# Patient Record
Sex: Female | Born: 1990 | Race: White | Hispanic: No | Marital: Single | Smoking: Current every day smoker
Health system: Southern US, Community
[De-identification: ages and names within clinical notes are randomized; demographics above are authoritative.]

## PROBLEM LIST (undated history)

## (undated) DIAGNOSIS — F1990 Other psychoactive substance use, unspecified, uncomplicated: Secondary | ICD-10-CM

---
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
## 2021-01-19 ENCOUNTER — Encounter: Payer: Self-pay | Admitting: Emergency Medicine

## 2021-01-19 ENCOUNTER — Emergency Department: Payer: Self-pay

## 2021-01-19 ENCOUNTER — Inpatient Hospital Stay: Payer: Self-pay | Admitting: Anesthesiology

## 2021-01-19 ENCOUNTER — Other Ambulatory Visit: Payer: Self-pay

## 2021-01-19 ENCOUNTER — Encounter
Admission: EM | Discharge status: Against Medical Advice | Payer: Self-pay | Source: Home / Self Care | Attending: Internal Medicine

## 2021-01-19 ENCOUNTER — Inpatient Hospital Stay
Admission: EM | Admit: 2021-01-19 | Discharge: 2021-01-23 | DRG: 872 | Discharge status: Against Medical Advice | Payer: Self-pay | Attending: Student | Admitting: Student

## 2021-01-19 DIAGNOSIS — S41102A Unspecified open wound of left upper arm, initial encounter: Secondary | ICD-10-CM

## 2021-01-19 DIAGNOSIS — W57XXXA Bitten or stung by nonvenomous insect and other nonvenomous arthropods, initial encounter: Secondary | ICD-10-CM | POA: Diagnosis present

## 2021-01-19 DIAGNOSIS — N390 Urinary tract infection, site not specified: Secondary | ICD-10-CM | POA: Diagnosis present

## 2021-01-19 DIAGNOSIS — F1990 Other psychoactive substance use, unspecified, uncomplicated: Secondary | ICD-10-CM

## 2021-01-19 DIAGNOSIS — Z20822 Contact with and (suspected) exposure to covid-19: Secondary | ICD-10-CM | POA: Diagnosis present

## 2021-01-19 DIAGNOSIS — A4101 Sepsis due to Methicillin susceptible Staphylococcus aureus: Principal | ICD-10-CM | POA: Diagnosis present

## 2021-01-19 DIAGNOSIS — F1390 Sedative, hypnotic, or anxiolytic use, unspecified, uncomplicated: Secondary | ICD-10-CM

## 2021-01-19 DIAGNOSIS — A419 Sepsis, unspecified organism: Secondary | ICD-10-CM

## 2021-01-19 DIAGNOSIS — I96 Gangrene, not elsewhere classified: Secondary | ICD-10-CM | POA: Diagnosis present

## 2021-01-19 DIAGNOSIS — F1110 Opioid abuse, uncomplicated: Secondary | ICD-10-CM | POA: Diagnosis present

## 2021-01-19 DIAGNOSIS — F17210 Nicotine dependence, cigarettes, uncomplicated: Secondary | ICD-10-CM | POA: Diagnosis present

## 2021-01-19 DIAGNOSIS — L03116 Cellulitis of left lower limb: Secondary | ICD-10-CM | POA: Diagnosis present

## 2021-01-19 DIAGNOSIS — Z5329 Procedure and treatment not carried out because of patient's decision for other reasons: Secondary | ICD-10-CM | POA: Diagnosis present

## 2021-01-19 DIAGNOSIS — R7881 Bacteremia: Secondary | ICD-10-CM

## 2021-01-19 DIAGNOSIS — B9620 Unspecified Escherichia coli [E. coli] as the cause of diseases classified elsewhere: Secondary | ICD-10-CM | POA: Diagnosis present

## 2021-01-19 DIAGNOSIS — F1490 Cocaine use, unspecified, uncomplicated: Secondary | ICD-10-CM

## 2021-01-19 DIAGNOSIS — S41101A Unspecified open wound of right upper arm, initial encounter: Secondary | ICD-10-CM

## 2021-01-19 DIAGNOSIS — L02416 Cutaneous abscess of left lower limb: Secondary | ICD-10-CM | POA: Diagnosis present

## 2021-01-19 DIAGNOSIS — S41109A Unspecified open wound of unspecified upper arm, initial encounter: Secondary | ICD-10-CM

## 2021-01-19 DIAGNOSIS — F1190 Opioid use, unspecified, uncomplicated: Secondary | ICD-10-CM

## 2021-01-19 DIAGNOSIS — S80869A Insect bite (nonvenomous), unspecified lower leg, initial encounter: Secondary | ICD-10-CM | POA: Diagnosis present

## 2021-01-19 HISTORY — PX: I & D EXTREMITY: SHX5045

## 2021-01-19 HISTORY — DX: Other psychoactive substance use, unspecified, uncomplicated: F19.90

## 2021-01-19 HISTORY — PX: INCISION AND DRAINAGE OF WOUND: SHX1803

## 2021-01-19 LAB — CBC WITH DIFFERENTIAL/PLATELET
Abs Immature Granulocytes: 0.83 10*3/uL — ABNORMAL HIGH (ref 0.00–0.07)
Basophils Absolute: 0.1 10*3/uL (ref 0.0–0.1)
Basophils Relative: 0 %
Eosinophils Absolute: 0.6 10*3/uL — ABNORMAL HIGH (ref 0.0–0.5)
Eosinophils Relative: 2 %
HCT: 26.5 % — ABNORMAL LOW (ref 36.0–46.0)
Hemoglobin: 8.5 g/dL — ABNORMAL LOW (ref 12.0–15.0)
Immature Granulocytes: 3 %
Lymphocytes Relative: 7 %
Lymphs Abs: 1.7 10*3/uL (ref 0.7–4.0)
MCH: 24.6 pg — ABNORMAL LOW (ref 26.0–34.0)
MCHC: 32.1 g/dL (ref 30.0–36.0)
MCV: 76.6 fL — ABNORMAL LOW (ref 80.0–100.0)
Monocytes Absolute: 1.2 10*3/uL — ABNORMAL HIGH (ref 0.1–1.0)
Monocytes Relative: 5 %
Neutro Abs: 20.5 10*3/uL — ABNORMAL HIGH (ref 1.7–7.7)
Neutrophils Relative %: 83 %
Platelets: 443 10*3/uL — ABNORMAL HIGH (ref 150–400)
RBC: 3.46 MIL/uL — ABNORMAL LOW (ref 3.87–5.11)
RDW: 16.9 % — ABNORMAL HIGH (ref 11.5–15.5)
WBC: 24.8 10*3/uL — ABNORMAL HIGH (ref 4.0–10.5)
nRBC: 0 % (ref 0.0–0.2)

## 2021-01-19 LAB — URINALYSIS, COMPLETE (UACMP) WITH MICROSCOPIC
Bilirubin Urine: NEGATIVE
Glucose, UA: NEGATIVE mg/dL
Hgb urine dipstick: NEGATIVE
Ketones, ur: 5 mg/dL — AB
Leukocytes,Ua: NEGATIVE
Nitrite: POSITIVE — AB
Protein, ur: NEGATIVE mg/dL
Specific Gravity, Urine: 1.03 (ref 1.005–1.030)
pH: 5 (ref 5.0–8.0)

## 2021-01-19 LAB — COMPREHENSIVE METABOLIC PANEL
ALT: 13 U/L (ref 0–44)
AST: 15 U/L (ref 15–41)
Albumin: 2.4 g/dL — ABNORMAL LOW (ref 3.5–5.0)
Alkaline Phosphatase: 110 U/L (ref 38–126)
Anion gap: 9 (ref 5–15)
BUN: 18 mg/dL (ref 6–20)
CO2: 24 mmol/L (ref 22–32)
Calcium: 8.4 mg/dL — ABNORMAL LOW (ref 8.9–10.3)
Chloride: 107 mmol/L (ref 98–111)
Creatinine, Ser: 0.79 mg/dL (ref 0.44–1.00)
GFR, Estimated: >60 mL/min (ref >60)
Glucose, Bld: 156 mg/dL — ABNORMAL HIGH (ref 70–99)
Potassium: 4.8 mmol/L (ref 3.5–5.1)
Sodium: 140 mmol/L (ref 135–145)
Total Bilirubin: 0.2 mg/dL — ABNORMAL LOW (ref 0.3–1.2)
Total Protein: 6.3 g/dL — ABNORMAL LOW (ref 6.5–8.1)

## 2021-01-19 LAB — RESP PANEL BY RT-PCR (FLU A&B, COVID) ARPGX2
Influenza A by PCR: NEGATIVE
Influenza B by PCR: NEGATIVE
SARS Coronavirus 2 by RT PCR: NEGATIVE

## 2021-01-19 LAB — POC URINE PREG, ED: Preg Test, Ur: NEGATIVE

## 2021-01-19 LAB — LACTIC ACID, PLASMA
Lactic Acid, Venous: 2.1 mmol/L (ref 0.5–1.9)
Lactic Acid, Venous: 1.9 mmol/L (ref 0.5–1.9)

## 2021-01-19 LAB — CK: Total CK: 95 U/L (ref 38–234)

## 2021-01-19 LAB — APTT: aPTT: 36 seconds (ref 24–36)

## 2021-01-19 LAB — PROTIME-INR
INR: 1.1 (ref 0.8–1.2)
Prothrombin Time: 14.5 seconds (ref 11.4–15.2)

## 2021-01-19 LAB — SEDIMENTATION RATE: Sed Rate: 49 mm/hr — ABNORMAL HIGH (ref 0–20)

## 2021-01-19 SURGERY — IRRIGATION AND DEBRIDEMENT WOUND
Anesthesia: General | Site: Leg Lower | Laterality: Left

## 2021-01-19 MED ORDER — LIDOCAINE HCL (CARDIAC) PF 100 MG/5ML IV SOSY
PREFILLED_SYRINGE | INTRAVENOUS | Status: DC | PRN
Start: 2021-01-19 — End: 2021-01-19
  Administered 2021-01-19: 60 mg via INTRAVENOUS

## 2021-01-19 MED ORDER — PHENYLEPHRINE HCL (PRESSORS) 10 MG/ML IV SOLN
INTRAVENOUS | Status: DC | PRN
  Administered 2021-01-19 (×2): 100 ug via INTRAVENOUS

## 2021-01-19 MED ORDER — ACETAMINOPHEN 10 MG/ML IV SOLN
INTRAVENOUS | Status: AC
  Administered 2021-01-19: 1000 mg via INTRAVENOUS
  Filled 2021-01-19: qty 100

## 2021-01-19 MED ORDER — ONDANSETRON HCL 4 MG/2ML IJ SOLN
INTRAMUSCULAR | Status: DC | PRN
  Administered 2021-01-19: 4 mg via INTRAVENOUS

## 2021-01-19 MED ORDER — KETAMINE HCL 10 MG/ML IJ SOLN
INTRAMUSCULAR | Status: AC
  Administered 2021-01-19: 18 mg
  Filled 2021-01-19: qty 1

## 2021-01-19 MED ORDER — HYDROMORPHONE HCL 1 MG/ML IJ SOLN
0.5000 mg | INTRAMUSCULAR | Status: DC | PRN
  Administered 2021-01-19 – 2021-01-23 (×9): 0.5 mg via INTRAVENOUS
  Filled 2021-01-19 (×9): qty 0.5

## 2021-01-19 MED ORDER — MIDAZOLAM HCL 2 MG/2ML IJ SOLN
INTRAMUSCULAR | Status: DC | PRN
  Administered 2021-01-19: 2 mg via INTRAVENOUS

## 2021-01-19 MED ORDER — LACTATED RINGERS IV BOLUS (SEPSIS)
1000.0000 mL | Freq: Once | INTRAVENOUS | Status: AC
  Administered 2021-01-19: 1000 mL via INTRAVENOUS

## 2021-01-19 MED ORDER — DEXAMETHASONE SODIUM PHOSPHATE 10 MG/ML IJ SOLN
INTRAMUSCULAR | Status: DC | PRN
  Administered 2021-01-19: 5 mg via INTRAVENOUS

## 2021-01-19 MED ORDER — LACTATED RINGERS IV SOLN: INTRAVENOUS | Status: DC

## 2021-01-19 MED ORDER — 0.9 % SODIUM CHLORIDE (POUR BTL) OPTIME
TOPICAL | Status: DC | PRN
  Administered 2021-01-19: 450 mL

## 2021-01-19 MED ORDER — KETOROLAC TROMETHAMINE 30 MG/ML IJ SOLN
30.0000 mg | Freq: Four times a day (QID) | INTRAMUSCULAR | Status: DC
  Administered 2021-01-20 – 2021-01-23 (×13): 30 mg via INTRAVENOUS
  Filled 2021-01-19 (×13): qty 1

## 2021-01-19 MED ORDER — FENTANYL CITRATE (PF) 100 MCG/2ML IJ SOLN
INTRAMUSCULAR | Status: DC | PRN
  Administered 2021-01-19 (×2): 50 ug via INTRAVENOUS
  Administered 2021-01-19: 100 ug via INTRAVENOUS

## 2021-01-19 MED ORDER — OXYCODONE HCL 5 MG PO TABS
ORAL_TABLET | ORAL | Status: AC
  Filled 2021-01-19: qty 1

## 2021-01-19 MED ORDER — VANCOMYCIN HCL 500 MG/100ML IV SOLN
500.0000 mg | Freq: Once | INTRAVENOUS | Status: AC
  Administered 2021-01-19: 500 mg via INTRAVENOUS
  Filled 2021-01-19 (×2): qty 100

## 2021-01-19 MED ORDER — SODIUM CHLORIDE 0.9 % IV SOLN
2.0000 g | Freq: Once | INTRAVENOUS | Status: AC
  Administered 2021-01-19: 2 g via INTRAVENOUS
  Filled 2021-01-19: qty 2

## 2021-01-19 MED ORDER — ONDANSETRON HCL 4 MG/2ML IJ SOLN: 4.0000 mg | Freq: Once | INTRAMUSCULAR | Status: DC | PRN

## 2021-01-19 MED ORDER — KETAMINE HCL 10 MG/ML IJ SOLN: 0.3000 mg/kg | Freq: Once | INTRAMUSCULAR | Status: AC

## 2021-01-19 MED ORDER — VANCOMYCIN HCL 1250 MG/250ML IV SOLN
1250.0000 mg | INTRAVENOUS | Status: DC
  Filled 2021-01-19: qty 250

## 2021-01-19 MED ORDER — ONDANSETRON HCL 4 MG PO TABS: 4.0000 mg | ORAL_TABLET | Freq: Four times a day (QID) | ORAL | Status: DC | PRN

## 2021-01-19 MED ORDER — SODIUM CHLORIDE 0.9 % IV SOLN
2.0000 g | Freq: Three times a day (TID) | INTRAVENOUS | Status: DC
  Administered 2021-01-19 – 2021-01-20 (×2): 2 g via INTRAVENOUS
  Filled 2021-01-19 (×5): qty 2

## 2021-01-19 MED ORDER — SODIUM CHLORIDE 0.9 % IV SOLN: Freq: Once | INTRAVENOUS | Status: AC

## 2021-01-19 MED ORDER — FENTANYL CITRATE (PF) 100 MCG/2ML IJ SOLN
INTRAMUSCULAR | Status: AC
  Filled 2021-01-19: qty 2

## 2021-01-19 MED ORDER — PROPOFOL 10 MG/ML IV BOLUS
INTRAVENOUS | Status: AC
  Filled 2021-01-19: qty 20

## 2021-01-19 MED ORDER — OXYCODONE HCL 5 MG PO TABS
5.0000 mg | ORAL_TABLET | Freq: Once | ORAL | Status: AC | PRN
  Administered 2021-01-19: 5 mg via ORAL

## 2021-01-19 MED ORDER — ACETAMINOPHEN 650 MG RE SUPP: 650.0000 mg | Freq: Four times a day (QID) | RECTAL | Status: DC | PRN

## 2021-01-19 MED ORDER — OXYCODONE HCL 5 MG/5ML PO SOLN
5.0000 mg | Freq: Once | ORAL | Status: AC | PRN
Start: 2021-01-19 — End: 2021-01-19

## 2021-01-19 MED ORDER — ACETAMINOPHEN 325 MG PO TABS: 650.0000 mg | ORAL_TABLET | Freq: Four times a day (QID) | ORAL | Status: DC | PRN

## 2021-01-19 MED ORDER — HYDROMORPHONE HCL 1 MG/ML IJ SOLN: 1.0000 mg | INTRAMUSCULAR | Status: DC | PRN

## 2021-01-19 MED ORDER — SUCCINYLCHOLINE CHLORIDE 200 MG/10ML IV SOSY
PREFILLED_SYRINGE | INTRAVENOUS | Status: DC | PRN
Start: 2021-01-19 — End: 2021-01-19
  Administered 2021-01-19: 80 mg via INTRAVENOUS

## 2021-01-19 MED ORDER — PROPOFOL 10 MG/ML IV BOLUS
INTRAVENOUS | Status: DC | PRN
  Administered 2021-01-19: 150 mg via INTRAVENOUS

## 2021-01-19 MED ORDER — OXYCODONE HCL 5 MG PO TABS
5.0000 mg | ORAL_TABLET | ORAL | Status: DC | PRN
  Administered 2021-01-20: 5 mg via ORAL
  Administered 2021-01-20: 10 mg via ORAL
  Administered 2021-01-20: 5 mg via ORAL
  Administered 2021-01-20 – 2021-01-23 (×9): 10 mg via ORAL
  Filled 2021-01-19 (×12): qty 2

## 2021-01-19 MED ORDER — BUPIVACAINE LIPOSOME 1.3 % IJ SUSP
INTRAMUSCULAR | Status: AC
  Filled 2021-01-19: qty 20

## 2021-01-19 MED ORDER — ACETAMINOPHEN 500 MG PO TABS: 1000.0000 mg | ORAL_TABLET | Freq: Four times a day (QID) | ORAL | Status: DC | PRN

## 2021-01-19 MED ORDER — LORAZEPAM 2 MG/ML IJ SOLN
1.0000 mg | INTRAMUSCULAR | Status: DC | PRN
  Administered 2021-01-21 – 2021-01-23 (×8): 1 mg via INTRAVENOUS
  Filled 2021-01-19 (×8): qty 1

## 2021-01-19 MED ORDER — VANCOMYCIN HCL IN DEXTROSE 1-5 GM/200ML-% IV SOLN
1000.0000 mg | Freq: Once | INTRAVENOUS | Status: AC
  Administered 2021-01-19: 1000 mg via INTRAVENOUS
  Filled 2021-01-19: qty 200

## 2021-01-19 MED ORDER — MIDAZOLAM HCL 2 MG/2ML IJ SOLN
INTRAMUSCULAR | Status: AC
  Filled 2021-01-19: qty 2

## 2021-01-19 MED ORDER — FENTANYL CITRATE (PF) 100 MCG/2ML IJ SOLN
INTRAMUSCULAR | Status: AC
  Administered 2021-01-19: 25 ug via INTRAVENOUS
  Filled 2021-01-19: qty 2

## 2021-01-19 MED ORDER — ONDANSETRON HCL 4 MG/2ML IJ SOLN: 4.0000 mg | Freq: Four times a day (QID) | INTRAMUSCULAR | Status: DC | PRN

## 2021-01-19 MED ORDER — IOHEXOL 350 MG/ML SOLN
80.0000 mL | Freq: Once | INTRAVENOUS | Status: AC | PRN
  Administered 2021-01-19: 80 mL via INTRAVENOUS

## 2021-01-19 MED ORDER — FENTANYL CITRATE (PF) 100 MCG/2ML IJ SOLN
25.0000 ug | INTRAMUSCULAR | Status: DC | PRN
  Administered 2021-01-19: 25 ug via INTRAVENOUS

## 2021-01-19 MED ORDER — KETAMINE HCL 50 MG/5ML IJ SOSY: 0.3000 mg/kg | PREFILLED_SYRINGE | Freq: Once | INTRAMUSCULAR | Status: DC

## 2021-01-19 MED ORDER — METRONIDAZOLE 500 MG/100ML IV SOLN
500.0000 mg | Freq: Once | INTRAVENOUS | Status: AC
  Administered 2021-01-19: 500 mg via INTRAVENOUS
  Filled 2021-01-19: qty 100

## 2021-01-19 MED ORDER — DEXMEDETOMIDINE (PRECEDEX) IN NS 20 MCG/5ML (4 MCG/ML) IV SYRINGE
PREFILLED_SYRINGE | INTRAVENOUS | Status: DC | PRN
  Administered 2021-01-19 (×2): 8 ug via INTRAVENOUS

## 2021-01-19 MED ORDER — LACTATED RINGERS IV SOLN: INTRAVENOUS | Status: AC

## 2021-01-19 MED ORDER — ACETAMINOPHEN 10 MG/ML IV SOLN: 1000.0000 mg | Freq: Once | INTRAVENOUS | Status: DC | PRN

## 2021-01-19 MED ORDER — ROCURONIUM BROMIDE 100 MG/10ML IV SOLN
INTRAVENOUS | Status: DC | PRN
  Administered 2021-01-19: 20 mg via INTRAVENOUS
  Administered 2021-01-19: 30 mg via INTRAVENOUS

## 2021-01-19 MED ORDER — SUGAMMADEX SODIUM 200 MG/2ML IV SOLN
INTRAVENOUS | Status: DC | PRN
  Administered 2021-01-19: 150 mg via INTRAVENOUS

## 2021-01-19 MED ORDER — BUPIVACAINE-EPINEPHRINE 0.5% -1:200000 IJ SOLN
INTRAMUSCULAR | Status: DC | PRN
  Administered 2021-01-19: 48 mL via SURGICAL_CAVITY

## 2021-01-19 MED ORDER — BUPIVACAINE-EPINEPHRINE (PF) 0.5% -1:200000 IJ SOLN
INTRAMUSCULAR | Status: AC
  Filled 2021-01-19: qty 30

## 2021-01-19 SURGICAL SUPPLY — 41 items
APPLIER CLIP 9.375 SM OPEN (CLIP) ×3
BNDG GAUZE ELAST 4 BULKY (GAUZE/BANDAGES/DRESSINGS) ×9 IMPLANT
CANISTER SUCT 1200ML W/VALVE (MISCELLANEOUS) ×3 IMPLANT
CHLORAPREP W/TINT 26 (MISCELLANEOUS) IMPLANT
CLIP APPLIE 9.375 SM OPEN (CLIP) ×2 IMPLANT
CNTNR SPEC 2.5X3XGRAD LEK (MISCELLANEOUS)
CONT SPEC 4OZ STER OR WHT (MISCELLANEOUS)
CONTAINER SPEC 2.5X3XGRAD LEK (MISCELLANEOUS) IMPLANT
DRAIN PENROSE 1/4X12 LTX STRL (WOUND CARE) IMPLANT
DRAIN PENROSE 12X.25 LTX STRL (MISCELLANEOUS) ×6 IMPLANT
DRAPE LAPAROTOMY 77X122 PED (DRAPES) ×3 IMPLANT
DRSG GAUZE FLUFF 36X18 (GAUZE/BANDAGES/DRESSINGS) IMPLANT
ELECT CAUTERY BLADE TIP 2.5 (TIP) ×3
ELECT REM PT RETURN 9FT ADLT (ELECTROSURGICAL) ×3
ELECTRODE CAUTERY BLDE TIP 2.5 (TIP) ×2 IMPLANT
ELECTRODE REM PT RTRN 9FT ADLT (ELECTROSURGICAL) ×2 IMPLANT
GAUZE 4X4 16PLY ~~LOC~~+RFID DBL (SPONGE) ×3 IMPLANT
GAUZE SPONGE 4X4 12PLY STRL (GAUZE/BANDAGES/DRESSINGS) ×9 IMPLANT
GLOVE SURG SYN 7.0 (GLOVE) ×6 IMPLANT
GLOVE SURG SYN 7.5  E (GLOVE) ×2
GLOVE SURG SYN 7.5 E (GLOVE) ×4 IMPLANT
GOWN STRL REUS W/ TWL LRG LVL3 (GOWN DISPOSABLE) ×4 IMPLANT
GOWN STRL REUS W/TWL LRG LVL3 (GOWN DISPOSABLE) ×2
HANDLE YANKAUER SUCT BULB TIP (MISCELLANEOUS) ×6 IMPLANT
KIT TURNOVER KIT A (KITS) ×3 IMPLANT
LABEL OR SOLS (LABEL) IMPLANT
MANIFOLD NEPTUNE II (INSTRUMENTS) ×3 IMPLANT
NEEDLE HYPO 22GX1.5 SAFETY (NEEDLE) ×3 IMPLANT
NS IRRIG 500ML POUR BTL (IV SOLUTION) ×6 IMPLANT
PACK BASIN MINOR ARMC (MISCELLANEOUS) ×3 IMPLANT
PAD ABD DERMACEA PRESS 5X9 (GAUZE/BANDAGES/DRESSINGS) IMPLANT
SOL PREP PVP 2OZ (MISCELLANEOUS)
SOLUTION PREP PVP 2OZ (MISCELLANEOUS) IMPLANT
SPONGE T-LAP 18X18 ~~LOC~~+RFID (SPONGE) ×6 IMPLANT
SUT SILK 2 0 (SUTURE) ×1
SUT SILK 2-0 18XBRD TIE 12 (SUTURE) ×2 IMPLANT
SWAB CULTURE AMIES ANAERIB BLU (MISCELLANEOUS) ×9 IMPLANT
SYR 20ML LL LF (SYRINGE) ×3 IMPLANT
SYR BULB IRRIG 60ML STRL (SYRINGE) ×3 IMPLANT
TUBING CONNECTING 10 (TUBING) ×3 IMPLANT
WATER STERILE IRR 500ML POUR (IV SOLUTION) ×3 IMPLANT

## 2021-01-19 NOTE — ED Notes (Signed)
Ultrasound at bedside

## 2021-01-19 NOTE — ED Notes (Signed)
1 set of blood cultures to sent to lab at this time.

## 2021-01-19 NOTE — Transfer of Care (Signed)
Immediate Anesthesia Transfer of Care Note  Patient: Cindy Cohen  Procedure(s) Performed: IRRIGATION AND DEBRIDEMENT WOUND LEFT LOWER LEG (Left: Leg Lower) IRRIGATION AND DEBRIDEMENT EXTREMITY BILATERAL ARMS (Bilateral: Arm Lower)  Patient Location: PACU  Anesthesia Type:General  Level of Consciousness: awake and alert   Airway & Oxygen Therapy: Patient connected to face mask oxygen  Post-op Assessment: Report given to RN  Post vital signs: stable  Last Vitals:  Vitals Value Taken Time  BP 139/78 01/19/21 2115  Temp    Pulse 90 01/19/21 2120  Resp 14 01/19/21 2120  SpO2 100 % 01/19/21 2120  Vitals shown include unvalidated device data.  Last Pain:  Vitals:   01/19/21 1525  TempSrc: Oral  PainSc: 10-Worst pain ever         Complications: No notable events documented.

## 2021-01-19 NOTE — Consult Note (Addendum)
Pharmacy Antibiotic Note  Cindy Cohen is a 30 y.o. female admitted on 01/19/2021 with left lower extremity abscess and bilateral forearm wounds. Patient with hx of IV drug use.  Pharmacy has been consulted for Vancomycin and Cefepime dosing. Imaging negative for osseous abnormality or gas within the fluid and no subcutaneous emphysema. S/p surgical I&D  Plan: Cefepime 2 gram Q8H Vancomycin 1500 mg LD x 1 Vancomycin 1250 mg Q24H. Goal AUC 400-550 Estimated AUC 462/Cmin 9 Scr 0.8, IBW, Vd 0.72 Follow up cultures    Height: 4\' 11"  (149.9 cm) Weight: 59 kg (130 lb) IBW/kg (Calculated) : 43.2  Temp (24hrs), Avg:98.3 F (36.8 C), Min:97.8 F (36.6 C), Max:98.7 F (37.1 C)  Recent Labs  Lab 01/19/21 1355 01/19/21 1611  WBC 24.8*  --   CREATININE 0.79  --   LATICACIDVEN 2.1* 1.9    Estimated Creatinine Clearance: 81.8 mL/min (by C-G formula based on SCr of 0.79 mg/dL).    No Known Allergies  Antimicrobials this admission: 8/27 vancomycin >>  8/27 cefepime >>  8/27 metronidazole  Dose adjustments this admission: N/a  Microbiology results: 8/27 BCx: sent 8/27 wound Cx: sent  8/27 UCx: sent  8/27 MRSA PCR: sent  Thank you for allowing pharmacy to be a part of this patient's care.  9/27, PharmD, BCPS Clinical Pharmacist   01/19/2021 7:33 PM

## 2021-01-19 NOTE — ED Notes (Signed)
Pt straight stuck for labs in right foot

## 2021-01-19 NOTE — Op Note (Signed)
Procedure Date:  01/19/2021  Pre-operative Diagnosis:  Left lower extremity abscess; bilateral forearm chronic wounds.  Post-operative Diagnosis: Left lower extremity abscess; bilateral forearm chronic wounds.  Procedure:   Incision and Drainage of deep subcutaneous and subfascial left lower extremity abscess Debridement of bilateral forearm chronic wounds, each approximately 40 cm2.  Surgeon:  Howie Ill, MD  Anesthesia:  General endotracheal  Estimated Blood Loss:  50 ml  Specimens:  Culture swab of left lower extremity abscess  Complications:  Bleeding from left greater saphenous vein requiring ligation at two points.  Indications for Procedure:  This is a 30 y.o. female with diagnosis of left lower extremity abscess, requiring drainage procedure.  She also has a chronic wound in each forearm from prior abscesses that she has tried to drain herself and have only gotten worse.  The risks of bleeding, abscess or infection, injury to surrounding structures, and need for further procedures were all discussed with the patient and she was willing to proceed.  Description of Procedure: The patient was correctly identified in the preoperative area and brought into the operating room.  The patient was placed supine with VTE prophylaxis in place.  Appropriate time-outs were performed.  Anesthesia was induced and the patient was intubated.  Appropriate antibiotics were infused.  The patient's left lower extremity was fully prepped and draped in usual sterile fashion.  The patient had in the proximal left lower leg, posteromedial location, two 1 cm skin ulceration points with the highest degree of fluctuance.  These two areas were incised, revealing copious amount of purulent fluid.  This was swabbed for culture and sent to micro.  The abscess cavity tracked superiorly to above the knee joint medially and posteriorly.  A counter incision was made in the posterior aspect above the knee to  allow for better drainage of the posterior component of this cavity.  Cautery was used for hemostasis.  Using Kelly forceps to evaluate the tracking of the abscess cavity, it was noted that it was tracking deep into the subfascial level medially both below the knee and above the knee.  Then a counter incision was made in the medial aspect below the knee.  In doing so, we encountered bleeding from what appeared to be the greater saphenous vein.  This was clipped proximally and distally to the bleeding point and then transected.  More purulent fluid was drained through that incision.  Kelly forceps were then inserted again through this new incision and the cavity was noted to track superiorly to just above the medial aspect of the knee joint.  A new counter incision was made in that most proximal point.  Again, bleeding from the GSV was encountered and it was ligated proximally and distally and then transected.  There was no further tracking either deep or proximal from that point.  There was also no further tracking noted going to the posterolateral or anterolateral portion of the left lower leg.  At that point, the abscess cavity was thoroughly irrigated through all the counter incisions.  40 ml of Exparel solution mixed with 0.5% bupivacaine with epi was infiltrated onto the wounds.  Then, three 1/4 inch penrose drains were looped between the incisions to keep the abscess cavity open and draining freely.  These drains were tied together using 2-0 silk ties.  The patient's left leg was then cleaned and dressing was placed consisting of 4x4 gauze over the drain sites and wrapped with Kerlix roll.  We then moved on to the  bilateral forearms.  The left forearm was first prepped with betadine.  Cautery and sharp dissection was used to clean the entire surface of the wound, which measured about 8 x 5 cm.  There were some mildly necrotic wound edges which were also debrided.  This left the wound base cleaner and with  better granulation tissue.  The forearm was then cleaned and the wound dressed with moist to dry 4x4 gauze and wrapped with Kerlix roll.  The right forearm was then prepped with betadine.  Similarly, cautery and sharp dissection was used to clean the entire surface of the wound, which measured about 8 x 5 cm.  There were some mildly necrotic wound edges which were also debrided.  This left the wound base cleaner and with better granulation tissue.  The forearm was then cleaned and the wound was dressed with moist to dry 4x4 gauze and wrapped with Kerlix roll.   The patient was then emerged from anesthesia, extubated, and brought to the recovery room for further management.  The patient tolerated the procedure well and all counts were correct at the end of the case.   Howie Ill, MD

## 2021-01-19 NOTE — ED Notes (Signed)
Surgeon at bedside.  

## 2021-01-19 NOTE — Anesthesia Preprocedure Evaluation (Signed)
Anesthesia Evaluation  Patient identified by MRN, date of birth, ID band Patient awake  General Assessment Comment:  Patient with deep abscess on knee, and bilateral forearm infected wounds. Meets criteria for sepsis.  Active IV drug user, last did "crack, heroin and xanax" earlier today at noon. Received ketamine in ED for analgesia.  Patient is unsure when she last ate exactly, but thinks it was early morning, definitely before 12pm.  Reviewed: Allergy & Precautions, NPO status , Patient's Chart, lab work & pertinent test results  History of Anesthesia Complications Negative for: history of anesthetic complications  Airway Mallampati: II  TM Distance: >3 FB Neck ROM: Full    Dental  (+) Poor Dentition   Pulmonary neg sleep apnea, neg COPD, Current SmokerPatient did not abstain from smoking.,    Pulmonary exam normal breath sounds clear to auscultation       Cardiovascular Exercise Tolerance: Good METS(-) hypertension(-) CAD and (-) Past MI negative cardio ROS  (-) dysrhythmias  Rhythm:Regular Rate:Normal - Systolic murmurs    Neuro/Psych negative neurological ROS  negative psych ROS   GI/Hepatic neg GERD  ,(+)     substance abuse  alcohol use, cocaine use and IV drug use,   Endo/Other  neg diabetes  Renal/GU negative Renal ROS     Musculoskeletal   Abdominal   Peds  Hematology   Anesthesia Other Findings No past medical history on file.  Reproductive/Obstetrics                             Anesthesia Physical Anesthesia Plan  ASA: 3 and emergent  Anesthesia Plan: General   Post-op Pain Management:    Induction: Intravenous and Rapid sequence  PONV Risk Score and Plan: 3 and Ondansetron, Dexamethasone, Midazolam and Treatment may vary due to age or medical condition  Airway Management Planned: Oral ETT  Additional Equipment: None  Intra-op Plan:   Post-operative Plan:  Extubation in OR  Informed Consent: I have reviewed the patients History and Physical, chart, labs and discussed the procedure including the risks, benefits and alternatives for the proposed anesthesia with the patient or authorized representative who has indicated his/her understanding and acceptance.     Dental advisory given  Plan Discussed with: CRNA and Surgeon  Anesthesia Plan Comments: (Discussed risks of anesthesia with patient, including PONV, sore throat, lip/dental damage. Rare risks discussed as well, such as cardiorespiratory and neurological sequelae, and allergic reactions. Patient counseled on being higher risk for anesthesia due to comorbidities: recent ingestion of cocaine, heroine, benzos. Patient was told about increased risk of cardiac and respiratory events, including death. Patient understands. )        Anesthesia Quick Evaluation

## 2021-01-19 NOTE — ED Provider Notes (Signed)
[facility] Emergency Department Provider Note ____________________________________________   Event Date/Time   First MD Initiated Contact with Patient 01/19/21 1442     (approximate)  I have reviewed the triage vital signs and the nursing notes.  HISTORY  Chief Complaint Insect Bite   HPI Cindy Cohen is a 30 y.o. femalewho presents to the ED for evaluation of left leg erythema, swelling and pain.   Chart review indicates no relevant hx.  Patient reports a history of IVDU, frequently using heroin as recently as earlier today.  She reports shooting up daily.  She presents to the ED for evaluation of a "bug bite" to her lower leg.  She reports noting no insect bites or trauma to her leg, but developing increasing swelling, erythema and pain to her left leg behind her knee over the past 5 days or so.  She reports that she was going to come in last night, but was watching a really good documentary, so waited until today.  She reports chronic wounds to her bilateral proximal and posterior forearms, for which she rubs Neosporin on there daily.  Denies syncopal episodes, known trauma, fever or chills, chest pain or shortness of breath.  No past medical history on file.  There are no problems to display for this patient.    Prior to Admission medications   Not on File    Allergies Patient has no known allergies.  No family history on file.  Social History Social History   Tobacco Use   Smoking status: Every Day    Packs/day: 0.50    Types: Cigarettes   Smokeless tobacco: Never  Substance Use Topics   Alcohol use: Yes   Drug use: Yes    Types: IV    Review of Systems  Constitutional: No fever/chills Eyes: No visual changes. ENT: No sore throat. Cardiovascular: Denies chest pain. Respiratory: Denies shortness of breath. Gastrointestinal: No abdominal pain.  No nausea, no vomiting.  No diarrhea.  No constipation. Genitourinary:  Negative for dysuria. Musculoskeletal: Negative for back pain.Marland Kitchen Positive for chronic wounds to bilateral forearms. Positive for increasing area of atraumatic pain, swelling and erythema to her left leg Skin: Negative for rash. Neurological: Negative for headaches, focal weakness or numbness.  ____________________________________________   PHYSICAL EXAM:  VITAL SIGNS: Vitals:   01/19/21 1630 01/19/21 1720  BP:  (!) 144/76  Pulse: 92 93  Resp: 15 20  Temp:    SpO2: 98% 98%     Constitutional: Alert and oriented.  Tearful and appears unwell. Eyes: Conjunctivae are normal. PERRL. EOMI. Head: Atraumatic. Nose: No congestion/rhinnorhea. Mouth/Throat: Mucous membranes are dry.  Oropharynx non-erythematous. Neck: No stridor. No cervical spine tenderness to palpation. Cardiovascular: Tachycardic rate, regular rhythm. Grossly normal heart sounds.  Good peripheral circulation. Respiratory: Normal respiratory effort.  No retractions. Lungs CTAB. Gastrointestinal: Soft , nondistended, nontender to palpation. No CVA tenderness. Musculoskeletal:  Ulcerative wounds to bilateral proximal forearms with exposed subcut tissue, as pictured below. Some surround erythema/induration without purulence or fluctuance.  Swelling, erythema and tenderness to her left leg at her site of pain. Coming to a head with fluctuance to her proximal calf.  Flat and contiguous erythema with induration is extending proximally from her popliteal fossa up to her distal thigh.  Left foot is distally neurovascularly intact. Neurologic:  Normal speech and language. No gross focal neurologic deficits are appreciated.  Skin:  Skin is warm, dry  Psychiatric: Mood and affect are normal. Speech and behavior are normal.  ____________________________________________   LABS (all labs ordered are listed, but only abnormal results are displayed)  Labs Reviewed  LACTIC ACID, PLASMA - Abnormal; Notable for the following  components:      Result Value   Lactic Acid, Venous 2.1 (*)    All other components within normal limits  COMPREHENSIVE METABOLIC PANEL - Abnormal; Notable for the following components:   Glucose, Bld 156 (*)    Calcium 8.4 (*)    Total Protein 6.3 (*)    Albumin 2.4 (*)    Total Bilirubin 0.2 (*)    All other components within normal limits  CBC WITH DIFFERENTIAL/PLATELET - Abnormal; Notable for the following components:   WBC 24.8 (*)    RBC 3.46 (*)    Hemoglobin 8.5 (*)    HCT 26.5 (*)    MCV 76.6 (*)    MCH 24.6 (*)    RDW 16.9 (*)    Platelets 443 (*)    Neutro Abs 20.5 (*)    Monocytes Absolute 1.2 (*)    Eosinophils Absolute 0.6 (*)    Abs Immature Granulocytes 0.83 (*)    All other components within normal limits  URINALYSIS, COMPLETE (UACMP) WITH MICROSCOPIC - Abnormal; Notable for the following components:   Color, Urine AMBER (*)    APPearance CLOUDY (*)    Ketones, ur 5 (*)    Nitrite POSITIVE (*)    Bacteria, UA MANY (*)    All other components within normal limits  RESP PANEL BY RT-PCR (FLU A&B, COVID) ARPGX2  CULTURE, BLOOD (ROUTINE X 2)  CULTURE, BLOOD (ROUTINE X 2)  URINE CULTURE  LACTIC ACID, PLASMA  PROTIME-INR  APTT  URINE DRUG SCREEN, QUALITATIVE ([facility] ONLY)  POC URINE PREG, ED   ____________________________________________  12 Lead EKG  Sinus rhythm, rate of 89 bpm.  Normal axis and intervals.  Nonspecific ST abnormality in lead III, no further evidence of acute ischemia. ____________________________________________  RADIOLOGY  ED MD interpretation: 1 view CXR reviewed by me without evidence of acute cardiopulmonary pathology.  Official radiology report(s): US Venous Img Lower Unilateral Left  Result Date: 01/19/2021 CLINICAL DATA:  IV drug use. Abscess or DVT. Swelling and redness of the left lower extremity. EXAM: Left LOWER EXTREMITY VENOUS DOPPLER ULTRASOUND TECHNIQUE: Gray-scale sonography with compression, as well as color and  duplex ultrasound, were performed to evaluate the deep venous system(s) from the level of the common femoral vein through the popliteal and proximal calf veins. COMPARISON:  None. FINDINGS: VENOUS Normal compressibility of the common femoral, superficial femoral, and popliteal veins, as well as the visualized calf veins. Visualized portions of profunda femoral vein and great saphenous vein unremarkable. No filling defects to suggest DVT on grayscale or color Doppler imaging. Doppler waveforms show normal direction of venous flow, normal respiratory plasticity and response to augmentation. OTHER There is a 5.4 x 2.7 x 6 cm heterogeneous isoechoic fluid collection within the popliteal fossa that extends inferiorly to the proximal calf. Limitations: none IMPRESSION: 1. No left lower extremity deep venous thrombosis. 2. A 5.4 x 2.7 x 6 cm heterogeneous isoechoic fluid collection within the left popliteal fossa extends inferiorly to the proximal calf. Finding may represent a liquified hematoma versus abscess. Electronically Signed   By: Tish Frederickson M.D.   On: 01/19/2021 16:20   DG Chest Port 1 View  Result Date: 01/19/2021 CLINICAL DATA:  Questionable sepsis. EXAM: PORTABLE CHEST 1 VIEW COMPARISON:  None. FINDINGS: The heart, hila, and mediastinum are normal. No pneumothorax.  No nodules or masses. No focal infiltrates. IMPRESSION: No active disease. Electronically Signed   By: Gerome Sam III M.D.   On: 01/19/2021 15:18    ____________________________________________   PROCEDURES and INTERVENTIONS  Procedure(s) performed (including Critical Care):  .1-3 Lead EKG Interpretation  Date/Time: 01/19/2021 4:02 PM Performed by: Delton Prairie, MD Authorized by: Delton Prairie, MD     Interpretation: abnormal     ECG rate:  106   ECG rate assessment: tachycardic     Rhythm: sinus tachycardia     Ectopy: none     Conduction: normal   Ultrasound ED Peripheral IV (Provider)  Date/Time: 01/19/2021 4:02  PM Performed by: Delton Prairie, MD Authorized by: Delton Prairie, MD   Procedure details:    Indications: multiple failed IV attempts and poor IV access     Skin Prep: chlorhexidine gluconate     Location: right basilic v.   Angiocath:  18 G   Bedside Ultrasound Guided: Yes     Images: not archived     Patient tolerated procedure without complications: Yes     Dressing applied: Yes   Ultrasound ED Peripheral IV (Provider)  Date/Time: 01/19/2021 4:03 PM Performed by: Delton Prairie, MD Authorized by: Delton Prairie, MD   Procedure details:    Indications: multiple failed IV attempts and poor IV access     Skin Prep: chlorhexidine gluconate     Location: right basilic v.   Angiocath:  20 G   Bedside Ultrasound Guided: Yes     Images: not archived     Patient tolerated procedure without complications: Yes     Dressing applied: Yes   .Critical Care  Date/Time: 01/19/2021 4:03 PM Performed by: Delton Prairie, MD Authorized by: Delton Prairie, MD   Critical care provider statement:    Critical care time (minutes):  45   Critical care was necessary to treat or prevent imminent or life-threatening deterioration of the following conditions:  Sepsis   Critical care was time spent personally by me on the following activities:  Discussions with consultants, evaluation of patient's response to treatment, examination of patient, ordering and performing treatments and interventions, ordering and review of laboratory studies, ordering and review of radiographic studies, pulse oximetry, re-evaluation of patient's condition, obtaining history from patient or surrogate and review of old charts  Medications  lactated ringers infusion ( Intravenous Not Given 01/19/21 1625)  vancomycin (VANCOCIN) IVPB 1000 mg/200 mL premix (1,000 mg Intravenous New Bag/Given 01/19/21 1714)  0.9 %  sodium chloride infusion ( Intravenous New Bag/Given 01/19/21 1712)  lactated ringers bolus 1,000 mL (0 mLs Intravenous Stopped  01/19/21 1544)    And  lactated ringers bolus 1,000 mL (0 mLs Intravenous Stopped 01/19/21 1634)  ceFEPIme (MAXIPIME) 2 g in sodium chloride 0.9 % 100 mL IVPB (0 g Intravenous Stopped 01/19/21 1544)  metroNIDAZOLE (FLAGYL) IVPB 500 mg (0 mg Intravenous Stopped 01/19/21 1635)  ketamine (KETALAR) injection 18 mg (18 mg Intravenous Given 01/19/21 1543)  iohexol (OMNIPAQUE) 350 MG/ML injection 80 mL (80 mLs Intravenous Contrast Given 01/19/21 1636)    ____________________________________________   MDM / ED COURSE   30 year old IV drug user presents to the ED with a wound to her leg, with evidence of sepsis due to a deep abscess requiring operative I&D and medical admission.  Tachycardic but hemodynamically stable.  She has various wounds, pictured above, but most concerning is what appears to be a deep abscess to her left leg.  No neurologic or vascular deficits.  Blood  work with further stigmata of sepsis with a white count of 25,000 and lactic acidosis, improving with resuscitation.  No evidence of DVT.  CT obtained at the direction of general surgery colleagues, with evidence of a deep abscess.  Surgery sees the patient in the ED and plans to take to the OR for I&D.  We will admit to medicine thereafter for further management of her sepsis.  Clinical Course as of 01/19/21 1730  Sat Jan 19, 2021  1504 USIV x2 placed by me [DS]  1508 Gen surg paged [DS]  1518 I discuss with Dr. Aleen CampiPiscoya who recommends CT of the left leg and will see the patient in consultation [DS]  1550 Reassessed while she's getting her u/s [DS]  1726 Dr. Aleen CampiPiscoya in the ED. Planning to take to the OR for I&D [DS]    Clinical Course User Index [DS] Delton PrairieSmith, Lynell Greenhouse, MD    ____________________________________________   FINAL CLINICAL IMPRESSION(S) / ED DIAGNOSES  Final diagnoses:  Sepsis without acute organ dysfunction, due to unspecified organism [facility](HCC)  IVDU (intravenous drug user)  Abscess of left leg     ED Discharge  Orders     None        Crissie Aloi   Note:  This document was prepared using Dragon voice recognition software and may include unintentional dictation errors.    Delton PrairieSmith, Larkyn Greenberger, MD 01/19/21 [phone]

## 2021-01-19 NOTE — ED Triage Notes (Addendum)
Pt via EMS from home. Pt noticed some redness, swelling, and pain for the past 5 days. Pt states that yesterday someone told her she a had bite mark to her calf. But pt did not know about the bite mark. Pt L foot and leg to her knee is swollen and red. Pt is A&OX4 and NAD. Pt has bilateral wounds to the both forearm from IV heroin use. Denies shooting up in her foot. Pt states they were blisters that have popped but now are in the healing stages.

## 2021-01-19 NOTE — ED Notes (Addendum)
Ct took pt for scan. Notified CT tech of Ketamine administration for pain control during u/s approx. 45 minutes earlier. Let them know vitals are WNL and pt has been tolerating the medication well.

## 2021-01-19 NOTE — Progress Notes (Signed)
CODE SEPSIS - PHARMACY COMMUNICATION  **Broad Spectrum Antibiotics should be administered within 1 hour of Sepsis diagnosis**  Time Code Sepsis Called/Page Received: 1453  Antibiotics Ordered: vancomycin, cefepime, and metronidazole  Time of 1st antibiotic administration: 1505  Additional action taken by pharmacy: None  If necessary, Name of Provider/Nurse Contacted: N/a    Jaynie Bream, PharmD Pharmacy Resident  01/19/2021 2:53 PM'

## 2021-01-19 NOTE — ED Notes (Addendum)
Blood cultures obtained via 18g R upper forearm IV

## 2021-01-19 NOTE — Anesthesia Procedure Notes (Signed)
Procedure Name: Intubation Date/Time: 01/19/2021 6:53 PM Performed by: Irving Burton, CRNA Pre-anesthesia Checklist: Patient identified, Patient being monitored, Timeout performed, Emergency Drugs available and Suction available Patient Re-evaluated:Patient Re-evaluated prior to induction Oxygen Delivery Method: Circle system utilized Preoxygenation: Pre-oxygenation with 100% oxygen Induction Type: IV induction and Rapid sequence Laryngoscope Size: 3 and McGraph Grade View: Grade I Tube type: Oral Tube size: 6.5 mm Number of attempts: 1 Airway Equipment and Method: Stylet Placement Confirmation: ETT inserted through vocal cords under direct vision, positive ETCO2 and breath sounds checked- equal and bilateral Secured at: 18 cm Tube secured with: Tape Dental Injury: Teeth and Oropharynx as per pre-operative assessment  Comments: Unremarkable RSI. MV not attempted

## 2021-01-19 NOTE — Brief Op Note (Signed)
01/19/2021  9:41 PM  PATIENT:  Genelle Bal  30 y.o. female  PRE-OPERATIVE DIAGNOSIS:  left lower extremity abscess, bilateral forearm chronic wounds  POST-OPERATIVE DIAGNOSIS:  left lower extremity abscess, bilateral forearm chronic wounds  PROCEDURE:  Procedure(s): IRRIGATION AND DEBRIDEMENT WOUND LEFT LOWER LEG (Left) IRRIGATION AND DEBRIDEMENT EXTREMITY BILATERAL ARMS (Bilateral)  SURGEON:  Surgeon(s) and Role:    * Bentleigh Waren, MD - Primary  ANESTHESIA:   general  EBL:   25 ml  BLOOD ADMINISTERED:none  DRAINS: Penrose drain in the Left lower extremity (x3 drains)    LOCAL MEDICATIONS USED:  BUPIVICAINE   SPECIMEN:  Source of Specimen:  culture swab left leg abscess  DISPOSITION OF SPECIMEN:   Microbiology  COUNTS:  YES  DICTATION: .Dragon Dictation  PLAN OF CARE: Admit to inpatient   PATIENT DISPOSITION:  PACU - hemodynamically stable.   Delay start of Pharmacological VTE agent (>24hrs) due to surgical blood loss or risk of bleeding: yes

## 2021-01-19 NOTE — ED Notes (Signed)
Notified Dr. Katrinka Blazing that pt had reported that she was concerned previously about joint pain and had been "taking a lot of Diclofenac" for her leg pain.

## 2021-01-19 NOTE — ED Notes (Signed)
Patient returned from CT

## 2021-01-19 NOTE — Anesthesia Postprocedure Evaluation (Signed)
Anesthesia Post Note  Patient: Deasiah Hagberg  Procedure(s) Performed: IRRIGATION AND DEBRIDEMENT WOUND LEFT LOWER LEG (Left: Leg Lower) IRRIGATION AND DEBRIDEMENT EXTREMITY BILATERAL ARMS (Bilateral: Arm Lower)  Patient location during evaluation: PACU Anesthesia Type: General Level of consciousness: awake and alert Pain management: pain level controlled Vital Signs Assessment: post-procedure vital signs reviewed and stable Respiratory status: spontaneous breathing, nonlabored ventilation, respiratory function stable and patient connected to nasal cannula oxygen Cardiovascular status: blood pressure returned to baseline and stable Postop Assessment: no apparent nausea or vomiting Anesthetic complications: no   No notable events documented.   Last Vitals:  Vitals:   01/19/21 2209 01/19/21 2245  BP: (!) 121/100 134/80  Pulse: 94 95  Resp: 20 20  Temp: 36.6 C 36.7 C  SpO2: 100% 100%    Last Pain:  Vitals:   01/19/21 2209  TempSrc:   PainSc: 9                  Corinda Gubler

## 2021-01-19 NOTE — Consult Note (Signed)
Date of Consultation:  01/19/2021  Requesting Physician:  Delton Prairieylan Smith, MD  Reason for Consultation:  left lower extremity abscess  History of Present Illness: Cindy BalStephanie Cohen is a 30 y.o. female presenting for evaluation of left lower extremity swelling redness and pain.  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  She has a history of abscesses in bilateral forearms which she has been draining herself and has created significant open wounds of bilateral forearms.  She reports that she has been applying peroxide with Neosporin and gauze.  She reports that she has never used IV drugs in her left leg and thinks potentially that she could have a bug bite to her left lower leg that has been worsening over the past 5 days.  She reports that she started having some drainage today while in the emergency room but prior to this the leg had been becoming more red, swollen, and tender.  She reports that the pain has been worsening and finally decided to come to the emergency room today.  In the emergency room, she was initially tachycardic 204 but no fevers.  Her white blood cell count is 24.8 and she had a lactic acid of 2.1.  She had a CT scan of the left lower extremity which showed an abscess in the posterior medial knee measuring approximately 8.4 x 3.2 x 12.6 cm, as well as soft tissue fat stranding throughout the mid thigh to the ankle consistent with cellulitis.  There is no gas within the fluid and no subcutaneous emphysema.  Past Medical History: -IV drug use -Bilateral forearm chronic wounds  Past Surgical History: None  Home Medications: Prior to Admission medications   None    Allergies: No Known Allergies  Social History:  reports that she has been smoking cigarettes. She has been smoking an average of .5 packs per day. She has never used smokeless tobacco. She reports current alcohol use. She reports current drug use. Drug: IV.    Family History: No family history on file.  Review of Systems: Review of Systems  Constitutional:  Negative for chills and fever.  HENT:  Negative for hearing loss.   Respiratory:  Negative for shortness of breath.   Cardiovascular:  Negative for chest pain.  Gastrointestinal:  Negative for abdominal pain, nausea and vomiting.  Genitourinary:  Negative for dysuria.  Musculoskeletal:  Negative for myalgias.  Skin:  Positive for rash.  Neurological:  Negative for dizziness.  Psychiatric/Behavioral:  Negative for depression.    Physical Exam BP (!) 157/87   Pulse 92   Temp 97.8 F (36.6 C) (Oral)   Resp 16   Ht 4\' 11"  (1.499 m)   Wt 59 kg   LMP 01/16/2021   SpO2 100%   BMI 26.26 kg/m  CONSTITUTIONAL: No acute distress HEENT:  Normocephalic, atraumatic, extraocular motion intact. NECK: Trachea is midline, and there is no jugular venous distension. RESPIRATORY:  Normal respiratory effort without pathologic use of accessory muscles. CARDIOVASCULAR: Regular rhythm and rate. GI: The abdomen is soft, nondistended, nontender. MUSCULOSKELETAL:  Normal muscle strength and tone in all four extremities.  No peripheral edema or cyanosis. SKIN: Patient has 2 small ulcerations in the posterior medial proximal lower leg where the majority of the fluid is located on her CT scan.  There is surrounding erythema and induration and fluctuance.  The erythema extends proximally to the distal third of the thigh and distally towards the ankle.  There are some erythema also on the lateral portion of the left lower leg no particular fluctuance is noted.  This may be more reactive cellulitis.  In the bilateral forearms, the patient has chronic wounds with mostly granulation tissue that are about 10 x 6 cm in size with some small areas of necrotic edges but no significant induration or erythema. NEUROLOGIC:  Motor and sensation is grossly normal.  Cranial nerves are grossly intact. PSYCH:  Alert and  oriented to person, place and time. Affect is normal.  Laboratory Analysis: Results for orders placed or performed during the hospital encounter of 01/19/21 (from the past 24 hour(s))  Lactic acid, plasma     Status: Abnormal   Collection Time: 01/19/21  1:55 PM  Result Value Ref Range   Lactic Acid, Venous 2.1 (HH) 0.5 - 1.9 mmol/L  Comprehensive metabolic panel     Status: Abnormal   Collection Time: 01/19/21  1:55 PM  Result Value Ref Range   Sodium 140 135 - 145 mmol/L   Potassium 4.8 3.5 - 5.1 mmol/L   Chloride 107 98 - 111 mmol/L   CO2 24 22 - 32 mmol/L   Glucose, Bld 156 (H) 70 - 99 mg/dL   BUN 18 6 - 20 mg/dL   Creatinine, Ser 0.03 0.44 - 1.00 mg/dL   Calcium 8.4 (L) 8.9 - 10.3 mg/dL   Total Protein 6.3 (L) 6.5 - 8.1 g/dL   Albumin 2.4 (L) 3.5 - 5.0 g/dL   AST 15 15 - 41 U/L   ALT 13 0 - 44 U/L   Alkaline Phosphatase 110 38 - 126 U/L   Total Bilirubin 0.2 (L) 0.3 - 1.2 mg/dL   GFR, Estimated >70 >48 mL/min   Anion gap 9 5 - 15  CBC with Differential     Status: Abnormal   Collection Time: 01/19/21  1:55 PM  Result Value Ref Range   WBC 24.8 (H) 4.0 - 10.5 K/uL   RBC 3.46 (L) 3.87 - 5.11 MIL/uL   Hemoglobin 8.5 (L) 12.0 - 15.0 g/dL   HCT 88.9 (L) 16.9 - 45.0 %   MCV 76.6 (L) 80.0 - 100.0 fL   MCH 24.6 (L) 26.0 - 34.0 pg   MCHC 32.1 30.0 - 36.0 g/dL   RDW 38.8 (H) 82.8 - 00.3 %   Platelets 443 (H) 150 - 400 K/uL   nRBC 0.0 0.0 - 0.2 %   Neutrophils Relative % 83 %   Neutro Abs 20.5 (H) 1.7 - 7.7 K/uL   Lymphocytes Relative 7 %   Lymphs Abs 1.7 0.7 - 4.0 K/uL   Monocytes Relative 5 %   Monocytes Absolute 1.2 (H) 0.1 - 1.0 K/uL   Eosinophils Relative 2 %   Eosinophils Absolute 0.6 (H) 0.0 - 0.5 K/uL   Basophils Relative 0 %   Basophils Absolute 0.1 0.0 - 0.1 K/uL   Immature Granulocytes 3 %   Abs Immature Granulocytes 0.83 (H) 0.00 - 0.07 K/uL  Protime-INR     Status: None   Collection Time: 01/19/21  2:38 PM  Result Value Ref Range   Prothrombin Time 14.5  11.4 - 15.2 seconds   INR 1.1 0.8 - 1.2  APTT     Status: None   Collection Time: 01/19/21  2:38 PM  Result Value Ref Range   aPTT 36 24 - 36 seconds  Urinalysis, Complete w Microscopic Nasopharyngeal Swab     Status: Abnormal   Collection Time: 01/19/21  2:41 PM  Result Value Ref Range   Color, Urine AMBER (A) YELLOW   APPearance CLOUDY (A) CLEAR   Specific Gravity, Urine 1.030 1.005 - 1.030   pH 5.0 5.0 - 8.0   Glucose, UA NEGATIVE NEGATIVE mg/dL   Hgb urine dipstick NEGATIVE NEGATIVE   Bilirubin Urine NEGATIVE NEGATIVE   Ketones, ur 5 (A) NEGATIVE mg/dL   Protein, ur NEGATIVE NEGATIVE mg/dL   Nitrite POSITIVE (A) NEGATIVE   Leukocytes,Ua NEGATIVE NEGATIVE   RBC / HPF 0-5 0 - 5 RBC/hpf   WBC, UA 0-5 0 - 5 WBC/hpf   Bacteria, UA MANY (A) NONE SEEN   Squamous Epithelial / LPF 6-10 0 - 5   Mucus PRESENT   Resp Panel by RT-PCR (Flu A&B, Covid) Nasopharyngeal Swab     Status: None   Collection Time: 01/19/21  3:07 PM   Specimen: Nasopharyngeal Swab; Nasopharyngeal(NP) swabs in vial transport medium  Result Value Ref Range   SARS Coronavirus 2 by RT PCR NEGATIVE NEGATIVE   Influenza A by PCR NEGATIVE NEGATIVE   Influenza B by PCR NEGATIVE NEGATIVE  POC urine preg, ED     Status: None   Collection Time: 01/19/21  3:12 PM  Result Value Ref Range   Preg Test, Ur Negative Negative  Lactic acid, plasma     Status: None   Collection Time: 01/19/21  4:11 PM  Result Value Ref Range   Lactic Acid, Venous 1.9 0.5 - 1.9 mmol/L    Imaging: US Venous Img Lower Unilateral Left  Result Date: 01/19/2021 CLINICAL DATA:  IV drug use. Abscess or DVT. Swelling and redness of the left lower extremity. EXAM: Left LOWER EXTREMITY VENOUS DOPPLER ULTRASOUND TECHNIQUE: Gray-scale sonography with compression, as well as color and duplex ultrasound, were performed to evaluate the deep venous system(s) from the level of the common femoral vein through the popliteal and proximal calf veins. COMPARISON:   None. FINDINGS: VENOUS Normal compressibility of the common femoral, superficial femoral, and popliteal veins, as well as the visualized calf veins. Visualized portions of profunda femoral vein and great saphenous vein unremarkable. No filling defects to suggest DVT on grayscale or color Doppler imaging. Doppler waveforms show normal direction of venous flow, normal respiratory plasticity and response to augmentation. OTHER There is a 5.4 x 2.7 x 6 cm heterogeneous isoechoic fluid collection within the popliteal fossa that extends inferiorly to the proximal calf. Limitations: none IMPRESSION: 1. No left lower extremity deep venous thrombosis. 2. A 5.4 x 2.7 x 6 cm heterogeneous isoechoic fluid collection within the left popliteal fossa extends inferiorly to the proximal calf. Finding may represent a liquified hematoma versus abscess. Electronically Signed   By: Tish Frederickson M.D.   On: 01/19/2021 16:20   DG Chest Port 1 View  Result Date: 01/19/2021 CLINICAL DATA:  Questionable sepsis. EXAM: PORTABLE CHEST 1 VIEW COMPARISON:  None. FINDINGS: The heart, hila, and mediastinum are normal. No pneumothorax. No nodules or masses. No focal infiltrates. IMPRESSION: No active disease. Electronically Signed   By: Gerome Sam III M.D.   On: 01/19/2021 15:18   CT EXTREMITY LOWER LEFT W CONTRAST  Result Date: 01/19/2021 CLINICAL DATA:  Left leg infection.  History of IV drug use. EXAM: CT OF THE LOWER LEFT EXTREMITY WITH CONTRAST TECHNIQUE: Multidetector CT imaging of the lower left extremity was performed according to the standard protocol following intravenous contrast administration. CONTRAST:  82mL OMNIPAQUE IOHEXOL 350 MG/ML SOLN COMPARISON:  Left lower extremity ultrasound from same day. FINDINGS: Bones/Joint/Cartilage No  fracture or dislocation. Joint spaces are preserved. No joint effusion. Ligaments Ligaments are suboptimally evaluated by CT. Muscles and Tendons Grossly intact. Soft tissue Ill-defined  partially rim enhancing fluid collection involving the posteromedial knee, measuring approximately 8.4 x 3.2 x 12.6 cm (series 5, image 303; series 8, image 86). The fluid collection involves the deep compartment between the pes anserine tendons and semimembranosus tendon with extension into the proximal lower leg along the medial gastrocnemius muscle. No subcutaneous emphysema. Scattered soft tissue fat stranding and fluid extending from the mid thigh to the ankle. Circumferential skin thickening of the lower leg. No soft tissue mass.  Reactive left inguinal lymphadenopathy. IMPRESSION: 1. Ill-defined abscess of the posteromedial knee, measuring approximately 8.4 x 3.2 x 12.6 cm. The fluid collection involves the deep compartment between the pes anserine tendons and semimembranosus tendon with extension into the proximal lower leg along the medial gastrocnemius muscle. 2. Scattered soft tissue fat stranding and fluid extending from the mid thigh to the ankle, consistent with cellulitis. No subcutaneous emphysema. 3. No acute osseous abnormality. Electronically Signed   By: Obie Dredge M.D.   On: 01/19/2021 17:28    Assessment and Plan: This is a 30 y.o. female with left lower extremity abscess and bilateral forearm wounds.  - Discussed with the patient that for the left lower extremity, we will have to take her to the operating room for incision and drainage possible debridement of her left lower extremity abscess.  Discussed with her that if there is necrotic tissue we will have to do more extensive debridement and potentially she may have larger open wounds.  If it looks like it is more not necrotizing infection, then we may be able to do small incisions with Penrose drains within the abscess cavity to allow for better drainage.  She may also need incisions on the lateral aspect of the left lower leg in case there is any extension of fluid into that area.  With regards to her forearm wounds, we will also  do debridement of both of them in the areas of mild necrosis to give clean wound edges.  Discussed with her the use of wet-to-dry gauze dressing changes for both of these wounds.  Discussed with her the risks of bleeding, infection, injury to surrounding structures, need for further procedures, and she is willing to proceed.  She has been started on IV broad-spectrum antibiotics including vancomycin, cefepime, and Flagyl.  She is currently n.p.o. for surgery.  We will take her to the OR now as there is anesthesia availability.  Face-to-face time spent with the patient and care providers was 110 minutes, with more than 50% of the time spent counseling, educating, and coordinating care of the patient.     Howie Ill, MD [facility] Pg:  [phone]

## 2021-01-19 NOTE — H&P (Addendum)
History and Physical   Cindy Cohen TEV:[id number] DOB: [birth year] DOA: 01/19/2021  PCP: Pcp, No  Patient coming from: Home via EMS  I have personally briefly reviewed patient's old medical records in [facility] EMR.  Chief Concern: Left lower extremity wound with redness and swelling  HPI: Cindy Cohen is a 30 y.o. female with medical history significant for IV drug use, specifically heroin, cocaine use, Xanax, infrequent EtOH use, presents emergency department for chief concerns of left lower extremity redness and swelling.  Patient is awake and alert and oriented and does not appear to be in acute distress at this time.  She fully participated in physical exam.  She states that she woke up and noticed that her leg was swollen and red.  She states that prior to going to bed she did not notice anything abnormal with her leg.  She states that it may have been a bug bite however she did not witness a bug biting her.  On physical exam she had bilateral upper extremity of the distal arm on the posterior surface with enlarging wounds.  Images have been provided in media.  She reports that those wounds have been present for several months.  She states that she has been using over-the-counter Neosporin cream and that she will dress it at home.  However the wound has drastically spread over the last several months.    She had bilateral upper extremity track marks.  She was forthright and endorsed IV heroin use.  She states the last time she used heroin was prior to emergency room presentation.  She also endorses smoking crack cocaine and that she takes unprescribed Xanax. She states she always uses clean needles every time she shoots heroin.  She states she last had an airport sample of alcohol yesterday.  She denies history of seizures or tremors when she did not have alcohol use.  She endorses feeling safe at home.  She states she is sexually active and she states she always uses protection.     Social history: She lives at home with her boyfriend.  She denies tobacco use.  She states she uses infrequent EtOH.  ROS: Constitutional: no weight change, no fever ENT/Mouth: no sore throat, no rhinorrhea Eyes: no eye pain, no vision changes Cardiovascular: no chest pain, no dyspnea,  + edema, no palpitations Respiratory: no cough, no sputum, no wheezing Gastrointestinal: no nausea, no vomiting, no diarrhea, no constipation Genitourinary: no urinary incontinence, no dysuria, no hematuria Musculoskeletal: no arthralgias, no myalgias Skin: + skin lesions, no pruritus, Neuro: + weakness, no loss of consciousness, no syncope Psych: no anxiety, no depression, no decrease appetite Heme/Lymph: no bruising, no bleeding  ED Course: Discussed with emergency medicine provider, patient requiring hospitalization for chief concerns of left lower extremity abscess.  Vitals in the emergency department was remarkable for temperature of 98.7, respiration rate of 20, initial heart rate was 104 and improved to 93, blood pressure 114/63, SPO2 of 100% on room air.  Labs in the emergency department was remarkable for WBC 24.8, hemoglobin 8.5, platelets 443, sodium 140, potassium 4.8, chloride 107, serum creatinine of 0.79, nonfasting blood glucose 156, EGFR greater than 60.  Lactic acid was initially elevated at 2.1 and improved to 1.9.  In the ED, Ms. Counce received vancomycin, cefepime, metronidazole IV, lactated Ringer 2 L bolus, LR infusion at 150 mL/h was initiated by EDP.  Assessment/Plan  Principal Problem:   Sepsis (HCC) Active Problems:   Abscess of left leg  Open wounds involving multiple regions of upper extremity   IV drug user   Cocaine use   Cellulitis and abscess of left leg   # Patient meets sepsis criteria  # Left lower extremity posterior leg abscess with cellulitis-present on admission # Bilateral upper extremity open wounds-present admission - Elevated WBC, increased  heart rate, mild increase in lactic acid, source of multiple wounds in setting of IV drug use - Check MRSA PCR - Patient is status post vancomycin, cefepime, metronidazole IV per ED provider - Blood cultures x2 is in process - UA was positive for nitrites however I have low clinical suspicion that the source is urine at this time as patient did not have any urinary symptoms including dysuria and no suprapubic tenderness on exam  # Bilateral upper extremity wounds-present on admission - General surgery has been consulted and patient is taken to the OR for irrigation and debridement  # IV drug use-counseling given by myself - Patient states that she understands she needs to quit IV drug use however she states that she has been on methadone clinic 5 times and has been having difficulty quitting - I encouraged her and expressed that I believe in her  # Pharmacologic DVT prophylaxis has not been initiated by myself as patient is going into the OR for procedure - A.m. team to initiate pharmacologic DVT prophylaxis when appropriate  Chart reviewed.   DVT prophylaxis:  Code Status: Full code Diet: N.p.o. Family Communication: No Disposition Plan: Pending clinical course Consults called: General surgery Admission status: MedSurg, inpatient, cardiac monitoring  Past Medical History:  Diagnosis Date   IV drug user    History reviewed. No pertinent surgical history.  Social History:  reports that she has been smoking cigarettes. She has been smoking an average of .5 packs per day. She has never used smokeless tobacco. She reports current alcohol use. She reports current drug use. Drugs: IV and "Crack" cocaine.  No Known Allergies History reviewed. No pertinent family history. Family history: Family history reviewed and not pertinent  Prior to Admission medications   Not on File   Physical Exam: Vitals:   01/19/21 1600 01/19/21 1630 01/19/21 1720 01/19/21 1730  BP: 121/69  (!) 144/76  (!) 157/87  Pulse: 92 92 93 92  Resp: $Remo'13 15 20 16  'rNELl$ Temp:      TempSrc:      SpO2: 93% 98% 98% 100%  Weight:      Height:       Constitutional: appears age-appropriate, NAD, calm, comfortable Eyes: PERRL, lids and conjunctivae normal ENMT: Mucous membranes are moist. Posterior pharynx clear of any exudate or lesions. Age-appropriate dentition. Hearing appropriate Neck: normal, supple, no masses, no thyromegaly Respiratory: clear to auscultation bilaterally, no wheezing, no crackles. Normal respiratory effort. No accessory muscle use.  Cardiovascular: Regular rate and rhythm, no murmurs / rubs / gallops.  Left lower extremity swelling and redness consistent with cellulitis. 2+ pedal pulses. No carotid bruits.  Abdomen: no tenderness, no masses palpated, no hepatosplenomegaly. Bowel sounds positive.  Musculoskeletal: no clubbing / cyanosis. No joint deformity upper and lower extremities. Good ROM, no contractures, no atrophy. Normal muscle tone.  Skin: no rashes, lesions, ulcers. No induration.  Bilateral upper extremity track marks.  Bilateral upper extremity wounds present on admission.  Left lower extremity edema and erythema consistent with cellulitis     Neurologic: Sensation intact. Strength 5/5 in all 4.  Psychiatric: Normal judgment and insight. Alert and oriented x 3. Normal mood.  EKG: independently reviewed, showing sinus rhythm with rate of 89, QTc 437  Chest x-ray on Admission: I personally reviewed and I agree with radiologist reading as below.  US Venous Img Lower Unilateral Left  Result Date: 01/19/2021 CLINICAL DATA:  IV drug use. Abscess or DVT. Swelling and redness of the left lower extremity. EXAM: Left LOWER EXTREMITY VENOUS DOPPLER ULTRASOUND TECHNIQUE: Gray-scale sonography with compression, as well as color and duplex ultrasound, were performed to evaluate the deep venous system(s) from the level of the common femoral vein through the popliteal and proximal calf  veins. COMPARISON:  None. FINDINGS: VENOUS Normal compressibility of the common femoral, superficial femoral, and popliteal veins, as well as the visualized calf veins. Visualized portions of profunda femoral vein and great saphenous vein unremarkable. No filling defects to suggest DVT on grayscale or color Doppler imaging. Doppler waveforms show normal direction of venous flow, normal respiratory plasticity and response to augmentation. OTHER There is a 5.4 x 2.7 x 6 cm heterogeneous isoechoic fluid collection within the popliteal fossa that extends inferiorly to the proximal calf. Limitations: none IMPRESSION: 1. No left lower extremity deep venous thrombosis. 2. A 5.4 x 2.7 x 6 cm heterogeneous isoechoic fluid collection within the left popliteal fossa extends inferiorly to the proximal calf. Finding may represent a liquified hematoma versus abscess. Electronically Signed   By: Tish Frederickson M.D.   On: 01/19/2021 16:20   DG Chest Port 1 View  Result Date: 01/19/2021 CLINICAL DATA:  Questionable sepsis. EXAM: PORTABLE CHEST 1 VIEW COMPARISON:  None. FINDINGS: The heart, hila, and mediastinum are normal. No pneumothorax. No nodules or masses. No focal infiltrates. IMPRESSION: No active disease. Electronically Signed   By: Gerome Sam III M.D.   On: 01/19/2021 15:18   CT EXTREMITY LOWER LEFT W CONTRAST  Result Date: 01/19/2021 CLINICAL DATA:  Left leg infection.  History of IV drug use. EXAM: CT OF THE LOWER LEFT EXTREMITY WITH CONTRAST TECHNIQUE: Multidetector CT imaging of the lower left extremity was performed according to the standard protocol following intravenous contrast administration. CONTRAST:  25mL OMNIPAQUE IOHEXOL 350 MG/ML SOLN COMPARISON:  Left lower extremity ultrasound from same day. FINDINGS: Bones/Joint/Cartilage No fracture or dislocation. Joint spaces are preserved. No joint effusion. Ligaments Ligaments are suboptimally evaluated by CT. Muscles and Tendons Grossly intact. Soft  tissue Ill-defined partially rim enhancing fluid collection involving the posteromedial knee, measuring approximately 8.4 x 3.2 x 12.6 cm (series 5, image 303; series 8, image 86). The fluid collection involves the deep compartment between the pes anserine tendons and semimembranosus tendon with extension into the proximal lower leg along the medial gastrocnemius muscle. No subcutaneous emphysema. Scattered soft tissue fat stranding and fluid extending from the mid thigh to the ankle. Circumferential skin thickening of the lower leg. No soft tissue mass.  Reactive left inguinal lymphadenopathy. IMPRESSION: 1. Ill-defined abscess of the posteromedial knee, measuring approximately 8.4 x 3.2 x 12.6 cm. The fluid collection involves the deep compartment between the pes anserine tendons and semimembranosus tendon with extension into the proximal lower leg along the medial gastrocnemius muscle. 2. Scattered soft tissue fat stranding and fluid extending from the mid thigh to the ankle, consistent with cellulitis. No subcutaneous emphysema. 3. No acute osseous abnormality. Electronically Signed   By: Obie Dredge M.D.   On: 01/19/2021 17:28    Labs on Admission: I have personally reviewed following labs  CBC: Recent Labs  Lab 01/19/21 1355  WBC 24.8*  NEUTROABS 20.5*  HGB 8.5*  HCT 26.5*  MCV 76.6*  PLT 415*   Basic Metabolic Panel: Recent Labs  Lab 01/19/21 1355  NA 140  K 4.8  CL 107  CO2 24  GLUCOSE 156*  BUN 18  CREATININE 0.79  CALCIUM 8.4*   GFR: Estimated Creatinine Clearance: 81.8 mL/min (by C-G formula based on SCr of 0.79 mg/dL).  Liver Function Tests: Recent Labs  Lab 01/19/21 1355  AST 15  ALT 13  ALKPHOS 110  BILITOT 0.2*  PROT 6.3*  ALBUMIN 2.4*   Coagulation Profile: Recent Labs  Lab 01/19/21 1438  INR 1.1   Urine analysis:    Component Value Date/Time   COLORURINE AMBER (A) 01/19/2021 1441   APPEARANCEUR CLOUDY (A) 01/19/2021 1441   APPEARANCEUR Hazy  08/19/2011 1939   LABSPEC 1.030 01/19/2021 1441   LABSPEC 1.025 08/19/2011 1939   PHURINE 5.0 01/19/2021 1441   GLUCOSEU NEGATIVE 01/19/2021 1441   GLUCOSEU Negative 08/19/2011 1939   HGBUR NEGATIVE 01/19/2021 1441   BILIRUBINUR NEGATIVE 01/19/2021 1441   BILIRUBINUR Negative 08/19/2011 1939   KETONESUR 5 (A) 01/19/2021 1441   PROTEINUR NEGATIVE 01/19/2021 1441   NITRITE POSITIVE (A) 01/19/2021 1441   LEUKOCYTESUR NEGATIVE 01/19/2021 1441   LEUKOCYTESUR Negative 08/19/2011 1939   Dr. Tobie Poet [facility]  If 7PM-7AM, please contact overnight-coverage provider If 7AM-7PM, please contact day coverage provider [email or web address]  01/19/2021, 7:46 PM

## 2021-01-19 NOTE — ED Notes (Signed)
Patient transported to CT 

## 2021-01-19 NOTE — Progress Notes (Signed)
PHARMACY -  BRIEF ANTIBIOTIC NOTE   Pharmacy has received consult(s) for vancomycin and cefepime from an ED provider.  The patient's profile has been reviewed for ht/wt/allergies/indication/available labs.    One time order(s) placed for vancomycin IV 1,000 mg x 1 and cefepime IV 2 grams x 1  Further antibiotics/pharmacy consults should be ordered by admitting physician if indicated.                       Thank you, Jaynie Bream, PharmD Pharmacy Resident  01/19/2021 2:54 PM

## 2021-01-19 NOTE — ED Notes (Signed)
Dr. Cox at bedside.  

## 2021-01-19 NOTE — ED Notes (Addendum)
Pt out of bathroom after returning from CT-- started NaCl and Vancomycin.

## 2021-01-20 ENCOUNTER — Encounter: Payer: Self-pay | Admitting: Surgery

## 2021-01-20 DIAGNOSIS — L02416 Cutaneous abscess of left lower limb: Secondary | ICD-10-CM

## 2021-01-20 LAB — C-REACTIVE PROTEIN: CRP: 23 mg/dL — ABNORMAL HIGH (ref <1.0)

## 2021-01-20 LAB — BLOOD CULTURE ID PANEL (REFLEXED) - BCID2

## 2021-01-20 LAB — BASIC METABOLIC PANEL
Anion gap: 7 (ref 5–15)
BUN: 12 mg/dL (ref 6–20)
CO2: 24 mmol/L (ref 22–32)
Calcium: 7.8 mg/dL — ABNORMAL LOW (ref 8.9–10.3)
Chloride: 107 mmol/L (ref 98–111)
Creatinine, Ser: 0.73 mg/dL (ref 0.44–1.00)
GFR, Estimated: >60 mL/min (ref >60)
Glucose, Bld: 188 mg/dL — ABNORMAL HIGH (ref 70–99)
Potassium: 4.5 mmol/L (ref 3.5–5.1)
Sodium: 138 mmol/L (ref 135–145)

## 2021-01-20 LAB — CBC
HCT: 23.7 % — ABNORMAL LOW (ref 36.0–46.0)
Hemoglobin: 7.7 g/dL — ABNORMAL LOW (ref 12.0–15.0)
MCH: 25.7 pg — ABNORMAL LOW (ref 26.0–34.0)
MCHC: 32.5 g/dL (ref 30.0–36.0)
MCV: 79 fL — ABNORMAL LOW (ref 80.0–100.0)
Platelets: 402 10*3/uL — ABNORMAL HIGH (ref 150–400)
RBC: 3 MIL/uL — ABNORMAL LOW (ref 3.87–5.11)
RDW: 17.2 % — ABNORMAL HIGH (ref 11.5–15.5)
WBC: 25.2 10*3/uL — ABNORMAL HIGH (ref 4.0–10.5)
nRBC: 0 % (ref 0.0–0.2)

## 2021-01-20 MED ORDER — NICOTINE POLACRILEX 2 MG MT GUM
2.0000 mg | CHEWING_GUM | OROMUCOSAL | Status: DC | PRN
  Administered 2021-01-20: 2 mg via ORAL
  Filled 2021-01-20 (×3): qty 1

## 2021-01-20 MED ORDER — METRONIDAZOLE 500 MG/100ML IV SOLN
500.0000 mg | Freq: Three times a day (TID) | INTRAVENOUS | Status: DC
  Administered 2021-01-20: 500 mg via INTRAVENOUS
  Filled 2021-01-20 (×3): qty 100

## 2021-01-20 MED ORDER — NICOTINE 21 MG/24HR TD PT24
21.0000 mg | MEDICATED_PATCH | Freq: Every day | TRANSDERMAL | Status: DC
  Administered 2021-01-20 – 2021-01-23 (×4): 21 mg via TRANSDERMAL
  Filled 2021-01-20 (×4): qty 1

## 2021-01-20 MED ORDER — ENOXAPARIN SODIUM 40 MG/0.4ML IJ SOSY
40.0000 mg | PREFILLED_SYRINGE | INTRAMUSCULAR | Status: DC
  Administered 2021-01-20 – 2021-01-23 (×4): 40 mg via SUBCUTANEOUS
  Filled 2021-01-20 (×4): qty 0.4

## 2021-01-20 MED ORDER — CEFAZOLIN SODIUM-DEXTROSE 2-4 GM/100ML-% IV SOLN
2.0000 g | Freq: Three times a day (TID) | INTRAVENOUS | Status: DC
  Administered 2021-01-20 – 2021-01-23 (×9): 2 g via INTRAVENOUS
  Filled 2021-01-20 (×13): qty 100

## 2021-01-20 MED ORDER — DIPHENHYDRAMINE HCL 12.5 MG/5ML PO ELIX
12.5000 mg | ORAL_SOLUTION | Freq: Four times a day (QID) | ORAL | Status: DC | PRN
  Administered 2021-01-20 – 2021-01-23 (×7): 12.5 mg via ORAL
  Filled 2021-01-20 (×9): qty 5

## 2021-01-20 NOTE — Plan of Care (Signed)
  Problem: Clinical Measurements: Goal: Ability to maintain clinical measurements within normal limits will improve Outcome: Progressing Goal: Will remain free from infection Outcome: Progressing Goal: Diagnostic test results will improve Outcome: Progressing Goal: Respiratory complications will improve Outcome: Progressing Goal: Cardiovascular complication will be avoided Outcome: Progressing   Problem: Pain Managment: Goal: General experience of comfort will improve Outcome: Progressing   Pt is very tearful & restless at times but cooperative. Complained of pain more on her left leg than her bil arm; Dilaudid IV and oxycodone given. Dressing changed on her Left leg.  Voiding well; uses BSC for now.

## 2021-01-20 NOTE — Progress Notes (Signed)
TOC consult for SA resources. Patient is also uninsured.  Spoke to patient. She declines PCP, medication, or SA resources at this time. She was encouraged to reach out if needs arise.  Alfonso Ramus, [place] [phone]

## 2021-01-20 NOTE — Consult Note (Signed)
[facility]       Reason for Consult: MSSA bacteremia (non-billable, patient not seen)  Referring Physician: CHAMP autoconsult   Principal Problem:   Sepsis (HCC) Active Problems:   Abscess of left leg   Open wounds involving multiple regions of upper extremity   IV drug user   Cocaine use   Cellulitis and abscess of left leg    enoxaparin (LOVENOX) injection  40 mg Subcutaneous Q24H   ketorolac  30 mg Intravenous Q6H   nicotine  21 mg Transdermal Daily    Recommendations: Stop vancomycin, cefepime, metronidazole I will start cefazolin TTE  Dr. Rivka Safer to do a formal consult Monday  Assessment: She has a history of IVDU with resultant abscesses and  blood cultures positive for MSSA on BCID.  I will change her to appropriate antibiotic coverage.  I would not anticipate Pseudomonas or anaerobic infection also to be involved.    Antibiotics: Vancomycin, cefepime, metronidazole  HPI: Cindy Cohen is a 30 y.o. female with history of IVDU and now with MSSA abscesses and bacteremia.     Past Medical History:  Diagnosis Date   IV drug user     Social History   Tobacco Use   Smoking status: Every Day    Packs/day: 0.50    Types: Cigarettes   Smokeless tobacco: Never  Substance Use Topics   Alcohol use: Yes   Drug use: Yes    Types: IV, "Crack" cocaine    History reviewed. No pertinent family history.  No Known Allergies  Lab Results  Component Value Date   WBC 25.2 (H) 01/20/2021   HGB 7.7 (L) 01/20/2021   HCT 23.7 (L) 01/20/2021   MCV 79.0 (L) 01/20/2021   PLT 402 (H) 01/20/2021    Lab Results  Component Value Date   CREATININE 0.73 01/20/2021   BUN 12 01/20/2021   NA 138 01/20/2021   K 4.5 01/20/2021   CL 107 01/20/2021   CO2 24 01/20/2021    Lab Results  Component Value Date   ALT 13 01/19/2021   AST 15 01/19/2021   ALKPHOS 110 01/19/2021     Microbiology: Recent Results (from the past 240 hour(s))   Blood Culture (routine x 2)     Status: None (Preliminary result)   Collection Time: 01/19/21  1:55 PM   Specimen: BLOOD  Result Value Ref Range Status   Specimen Description BLOOD RT FOOT  Final   Special Requests Blood Culture adequate volume  Final   Culture   Final    NO GROWTH < 24 HOURS Performed at [facility], [address] [place], [place] [address]    Report Status PENDING  Incomplete  Resp Panel by RT-PCR (Flu A&B, Covid) Nasopharyngeal Swab     Status: None   Collection Time: 01/19/21  3:07 PM   Specimen: Nasopharyngeal Swab; Nasopharyngeal(NP) swabs in vial transport medium  Result Value Ref Range Status   SARS Coronavirus 2 by RT PCR NEGATIVE NEGATIVE Final    Comment: (NOTE) SARS-CoV-2 target nucleic acids are NOT DETECTED.  The SARS-CoV-2 RNA is generally detectable in upper respiratory specimens during the acute phase of infection. The lowest concentration of SARS-CoV-2 viral copies this assay can detect is 138 copies/mL. A negative result does not preclude SARS-Cov-2 infection and should not be used as the sole basis for treatment or other patient management decisions. A negative result may occur with  improper specimen collection/handling, submission of specimen other than  nasopharyngeal swab, presence of viral mutation(s) within the areas targeted by this assay, and inadequate number of viral copies(<138 copies/mL). A negative result must be combined with clinical observations, patient history, and epidemiological information. The expected result is Negative.  Fact Sheet for Patients:  BloggerCourse.com  Fact Sheet for Healthcare Providers:  SeriousBroker.it  This test is no t yet approved or cleared by the [place] FDA and  has been authorized for detection and/or diagnosis of SARS-CoV-2 by FDA under an Emergency Use Authorization (EUA). This EUA will remain  in effect (meaning this test  can be used) for the duration of the COVID-19 declaration under Section 564(b)(1) of the Act, 21 U.S.C.section 360bbb-3(b)(1), unless the authorization is terminated  or revoked sooner.       Influenza A by PCR NEGATIVE NEGATIVE Final   Influenza B by PCR NEGATIVE NEGATIVE Final    Comment: (NOTE) The Xpert Xpress SARS-CoV-2/FLU/RSV plus assay is intended as an aid in the diagnosis of influenza from Nasopharyngeal swab specimens and should not be used as a sole basis for treatment. Nasal washings and aspirates are unacceptable for Xpert Xpress SARS-CoV-2/FLU/RSV testing.  Fact Sheet for Patients: BloggerCourse.com  Fact Sheet for Healthcare Providers: SeriousBroker.it  This test is not yet approved or cleared by the [place] FDA and has been authorized for detection and/or diagnosis of SARS-CoV-2 by FDA under an Emergency Use Authorization (EUA). This EUA will remain in effect (meaning this test can be used) for the duration of the COVID-19 declaration under Section 564(b)(1) of the Act, 21 U.S.C. section 360bbb-3(b)(1), unless the authorization is terminated or revoked.  Performed at [facility], [address] [place], [place] [address]   Blood Culture (routine x 2)     Status: None (Preliminary result)   Collection Time: 01/19/21  3:09 PM   Specimen: BLOOD  Result Value Ref Range Status   Specimen Description BLOOD  BLRA  Final   Special Requests   Final    Blood Culture results may not be optimal due to an inadequate volume of blood received in culture bottles   Culture  Setup Time   Final    Organism ID to follow IN BOTH AEROBIC AND ANAEROBIC BOTTLES GRAM POSITIVE COCCI CRITICAL RESULT CALLED TO, READ BACK BY AND VERIFIED WITH: ABBY ELLINGTON AT 0722 01/20/21.PMF Performed at [facility], [address] [place], [place] [address]    Culture [facility] POSITIVE COCCI  Final   Report Status  PENDING  Incomplete  Blood Culture ID Panel (Reflexed)     Status: Abnormal   Collection Time: 01/19/21  3:09 PM  Result Value Ref Range Status   Enterococcus faecalis NOT DETECTED NOT DETECTED Final   Enterococcus Faecium NOT DETECTED NOT DETECTED Final   Listeria monocytogenes NOT DETECTED NOT DETECTED Final   Staphylococcus species DETECTED (A) NOT DETECTED Final    Comment: CRITICAL RESULT CALLED TO, READ BACK BY AND VERIFIED WITH: ABBY ELLINGTON AT 0722 01/20/21.PMF    Staphylococcus aureus (BCID) DETECTED (A) NOT DETECTED Final    Comment: CRITICAL RESULT CALLED TO, READ BACK BY AND VERIFIED WITH: ABBY ELLINGTON AT 0722 01/20/21.PMF    Staphylococcus epidermidis NOT DETECTED NOT DETECTED Final   Staphylococcus lugdunensis NOT DETECTED NOT DETECTED Final   Streptococcus species NOT DETECTED NOT DETECTED Final   Streptococcus agalactiae NOT DETECTED NOT DETECTED Final   Streptococcus pneumoniae NOT DETECTED NOT DETECTED Final   Streptococcus pyogenes NOT DETECTED NOT DETECTED Final   A.calcoaceticus-baumannii NOT DETECTED  NOT DETECTED Final   Bacteroides fragilis NOT DETECTED NOT DETECTED Final   Enterobacterales NOT DETECTED NOT DETECTED Final   Enterobacter cloacae complex NOT DETECTED NOT DETECTED Final   Escherichia coli NOT DETECTED NOT DETECTED Final   Klebsiella aerogenes NOT DETECTED NOT DETECTED Final   Klebsiella oxytoca NOT DETECTED NOT DETECTED Final   Klebsiella pneumoniae NOT DETECTED NOT DETECTED Final   Proteus species NOT DETECTED NOT DETECTED Final   Salmonella species NOT DETECTED NOT DETECTED Final   Serratia marcescens NOT DETECTED NOT DETECTED Final   Haemophilus influenzae NOT DETECTED NOT DETECTED Final   Neisseria meningitidis NOT DETECTED NOT DETECTED Final   Pseudomonas aeruginosa NOT DETECTED NOT DETECTED Final   Stenotrophomonas maltophilia NOT DETECTED NOT DETECTED Final   Candida albicans NOT DETECTED NOT DETECTED Final   Candida auris NOT  DETECTED NOT DETECTED Final   Candida glabrata NOT DETECTED NOT DETECTED Final   Candida krusei NOT DETECTED NOT DETECTED Final   Candida parapsilosis NOT DETECTED NOT DETECTED Final   Candida tropicalis NOT DETECTED NOT DETECTED Final   Cryptococcus neoformans/gattii NOT DETECTED NOT DETECTED Final   Meth resistant mecA/C and MREJ NOT DETECTED NOT DETECTED Final    Comment: Performed at [facility], [address] [place], [place] [address]  Aerobic/Anaerobic Culture w Gram Stain (surgical/deep wound)     Status: None (Preliminary result)   Collection Time: 01/19/21  7:01 PM   Specimen: Wound  Result Value Ref Range Status   Specimen Description   Final    ABSCESS LOWER LEFT LEG Performed at [facility], [address] [place], [place] [address]    Special Requests   Final    NONE Performed at [facility], [address] [place], [place] [address]    Gram Stain   Final    RARE SQUAMOUS EPITHELIAL CELLS PRESENT FEW WBC SEEN FEW GRAM POSITIVE COCCI Performed at [facility], [address] [place], [place] [address]    Culture PENDING  Incomplete   Report Status PENDING  Incomplete    Gardiner Barefoot, MD [facility] [email or web address] 01/20/2021, 12:28 PM

## 2021-01-20 NOTE — Consult Note (Signed)
WOC Nurse Consult Note: Reason for Consult:Wound care to bilateral forearms and left posterior LE. Patient is s/p Irrigation and Debridement of these wounds yesterday (today is POD 1) by Dr. Aleen Campi.  Patient with history of IV drug use. She presented to the ED with abscesses in the bilateral forearms which she had been draining herself and dressing with triple antibiotic ointment (Neosporin). She stated the LLE wound was a bite, but endorses she did not see the bite occurring. Wound type: chronic, nonhealing full thickness, infection Pressure Injury POA:N/A Wound care s/p surgical procedure was discussed with patient preoperatively as wet to dry, which is often performed twice daily. Post surgical wound care will be directed by the surgeon. There is no role for WOC nursing at this time. WOC nursing team will not follow, but will remain available to this patient, the nursing and medical teams.  Please re-consult if needed. Thanks, Ladona Mow, MSN, RN, GNP, [place]  Pager# [phone]

## 2021-01-20 NOTE — Care Management (Signed)
Remote consultation from ID noted, Dr. Luciana Axe.  Antibiotics have been de-escalated to Ancef monotherapy.  This is based on culture data with MSSA.    Lolita Patella MD

## 2021-01-20 NOTE — Progress Notes (Signed)
PHARMACY - PHYSICIAN COMMUNICATION CRITICAL VALUE ALERT - BLOOD CULTURE IDENTIFICATION (BCID)  Cindy Cohen is an 30 y.o. female who presented to [facility] on 01/19/2021 with left lower extremity abscess and bilateral forearm chronic wounds s/p irrigation and debridement of lower extremity and bilateral arms  Assessment:  2/4 bottles (1 set) MSSA  Name of physician (or Provider) Contacted: Dr. Georgeann Oppenheim  Current antibiotics: vancomycin, cefepime, metronidazole  Per discussion with provider, will continue broad spectrum antibiotics given appearance of wounds. Pending ID consult for further recommendations.   Results for orders placed or performed during the hospital encounter of 01/19/21  Blood Culture ID Panel (Reflexed) (Collected: 01/19/2021  3:09 PM)  Result Value Ref Range   Enterococcus faecalis NOT DETECTED NOT DETECTED   Enterococcus Faecium NOT DETECTED NOT DETECTED   Listeria monocytogenes NOT DETECTED NOT DETECTED   Staphylococcus species DETECTED (A) NOT DETECTED   Staphylococcus aureus (BCID) DETECTED (A) NOT DETECTED   Staphylococcus epidermidis NOT DETECTED NOT DETECTED   Staphylococcus lugdunensis NOT DETECTED NOT DETECTED   Streptococcus species NOT DETECTED NOT DETECTED   Streptococcus agalactiae NOT DETECTED NOT DETECTED   Streptococcus pneumoniae NOT DETECTED NOT DETECTED   Streptococcus pyogenes NOT DETECTED NOT DETECTED   A.calcoaceticus-baumannii NOT DETECTED NOT DETECTED   Bacteroides fragilis NOT DETECTED NOT DETECTED   Enterobacterales NOT DETECTED NOT DETECTED   Enterobacter cloacae complex NOT DETECTED NOT DETECTED   Escherichia coli NOT DETECTED NOT DETECTED   Klebsiella aerogenes NOT DETECTED NOT DETECTED   Klebsiella oxytoca NOT DETECTED NOT DETECTED   Klebsiella pneumoniae NOT DETECTED NOT DETECTED   Proteus species NOT DETECTED NOT DETECTED   Salmonella species NOT DETECTED NOT DETECTED   Serratia marcescens NOT DETECTED NOT DETECTED    Haemophilus influenzae NOT DETECTED NOT DETECTED   Neisseria meningitidis NOT DETECTED NOT DETECTED   Pseudomonas aeruginosa NOT DETECTED NOT DETECTED   Stenotrophomonas maltophilia NOT DETECTED NOT DETECTED   Candida albicans NOT DETECTED NOT DETECTED   Candida auris NOT DETECTED NOT DETECTED   Candida glabrata NOT DETECTED NOT DETECTED   Candida krusei NOT DETECTED NOT DETECTED   Candida parapsilosis NOT DETECTED NOT DETECTED   Candida tropicalis NOT DETECTED NOT DETECTED   Cryptococcus neoformans/gattii NOT DETECTED NOT DETECTED   Meth resistant mecA/C and MREJ NOT DETECTED NOT DETECTED    Pricilla Riffle, PharmD, BCPS Clinical Pharmacist 01/20/2021 7:47 AM

## 2021-01-20 NOTE — Progress Notes (Signed)
PROGRESS NOTE    Cindy Cohen  ZOX:[id number] DOB: 1990-07-19 DOA: 01/19/2021 PCP: Pcp, No    Brief Narrative:  30 y.o. female with medical history significant for IV drug use, specifically heroin, cocaine use, Xanax, infrequent EtOH use, presents emergency department for chief concerns of left lower extremity redness and swelling.  Patient is awake and alert and oriented and does not appear to be in acute distress at this time.  She fully participated in physical exam.  She states that she woke up and noticed that her leg was swollen and red.  She states that prior to going to bed she did not notice anything abnormal with her leg.  She states that it may have been a bug bite however she did not witness a bug biting her.   On physical exam she had bilateral upper extremity of the distal arm on the posterior surface with enlarging wounds.  Images have been provided in media.  She reports that those wounds have been present for several months.  She states that she has been using over-the-counter Neosporin cream and that she will dress it at home.  However the wound has drastically spread over the last several months.     She had bilateral upper extremity track marks.  She was forthright and endorsed IV heroin use.  She states the last time she used heroin was prior to emergency room presentation.  She also endorses smoking crack cocaine and that she takes unprescribed Xanax. She states she always uses clean needles every time she shoots heroin.  Status post surgical I&D on 8/27 with Dr. Aleen Campi for thigh abscess.  Uncomplicated postoperative course.  Blood cultures now 2/4 with Staph aureus.   Assessment & Plan:   Principal Problem:   Sepsis (HCC) Active Problems:   Abscess of left leg   Open wounds involving multiple regions of upper extremity   IV drug user   Cocaine use   Cellulitis and abscess of left leg  Left lower extremity posterior leg abscess with associated  cellulitis Bilateral upper extremity wounds Sepsis secondary to above Multiple sources of leukocytosis, tachycardia, mild increase in lactic acid Status post vancomycin, cefepime, Flagyl in ED Status post surgical I&D for left lower extremity abscess Plan: Postoperative care Multimodal pain control Continue broad-spectrum IV antibiotics IV fluids Monitor vitals and fever curve Follow cultures Wound care Appreciate surgical follow-up  Staphylococcal bacteremia In the setting of IV drug use Antibiotics as above  Polysubstance abuse IV drug use Patient was counseled  Tobacco use Nicotine patch Nicotine gum    DVT prophylaxis: SQ Lovenox Code Status: Full Family Communication: Family member at bedside Disposition Plan: Status is: Inpatient  Remains inpatient appropriate because:IV treatments appropriate due to intensity of illness or inability to take PO and Inpatient level of care appropriate due to severity of illness  Dispo: The patient is from: Home              Anticipated d/c is to: Home              Patient currently is not medically stable to d/c.   Difficult to place patient No       Level of care: Med-Surg  Consultants:  General surgery  Procedures:  Surgical I&D left leg abscess 8/27  Antimicrobials:  Vancomycin Cefepime Metronidazole   Subjective: Seen and examined.  Very anxious appearing.  Difficult to redirect  Objective: Vitals:   01/19/21 2245 01/19/21 2345 01/20/21 0306 01/20/21 0842  BP: 134/80 126/89  128/64 (!) 126/104  Pulse: 95 88 88 (!) 102  Resp: 20 16 18 16   Temp: 98 F (36.7 C) 97.6 F (36.4 C) 97.6 F (36.4 C) 98.3 F (36.8 C)  TempSrc:    Oral  SpO2: 100% 99% 95% 97%  Weight:      Height:        Intake/Output Summary (Last 24 hours) at 01/20/2021 1222 Last data filed at 01/20/2021 0326 Gross per 24 hour  Intake 3144.39 ml  Output 805 ml  Net 2339.39 ml   Filed Weights   01/19/21 1349  Weight: 59 kg     Examination:  General exam: Appears anxious Respiratory system: Lungs clear.  Normal work of breathing.  Room air Cardiovascular system: S1-S2, regular rate and rhythm, no murmurs Gastrointestinal system: Abdomen is nondistended, soft and nontender. No organomegaly or masses felt. Normal bowel sounds heard. Central nervous system: Alert and oriented. No focal neurological deficits. Extremities: Symmetric 5 x 5 power. Skin: Bilateral upper extremities in surgical dressings.  Left lower extremity in surgical dressings.  Serosanguineous drainage noted Psychiatry: Judgement and insight appear normal. Mood & affect anxious.     Data Reviewed: I have personally reviewed following labs and imaging studies  CBC: Recent Labs  Lab 01/19/21 1355 01/20/21 0510  WBC 24.8* 25.2*  NEUTROABS 20.5*  --   HGB 8.5* 7.7*  HCT 26.5* 23.7*  MCV 76.6* 79.0*  PLT 443* 402*   Basic Metabolic Panel: Recent Labs  Lab 01/19/21 1355 01/20/21 0510  NA 140 138  K 4.8 4.5  CL 107 107  CO2 24 24  GLUCOSE 156* 188*  BUN 18 12  CREATININE 0.79 0.73  CALCIUM 8.4* 7.8*   GFR: Estimated Creatinine Clearance: 81.8 mL/min (by C-G formula based on SCr of 0.73 mg/dL). Liver Function Tests: Recent Labs  Lab 01/19/21 1355  AST 15  ALT 13  ALKPHOS 110  BILITOT 0.2*  PROT 6.3*  ALBUMIN 2.4*   No results for input(s): LIPASE, AMYLASE in the last 168 hours. No results for input(s): AMMONIA in the last 168 hours. Coagulation Profile: Recent Labs  Lab 01/19/21 1438  INR 1.1   Cardiac Enzymes: Recent Labs  Lab 01/19/21 2227  CKTOTAL 95   BNP (last 3 results) No results for input(s): PROBNP in the last 8760 hours. HbA1C: No results for input(s): HGBA1C in the last 72 hours. CBG: No results for input(s): GLUCAP in the last 168 hours. Lipid Profile: No results for input(s): CHOL, HDL, LDLCALC, TRIG, CHOLHDL, LDLDIRECT in the last 72 hours. Thyroid Function Tests: No results for input(s):  TSH, T4TOTAL, FREET4, T3FREE, THYROIDAB in the last 72 hours. Anemia Panel: No results for input(s): VITAMINB12, FOLATE, FERRITIN, TIBC, IRON, RETICCTPCT in the last 72 hours. Sepsis Labs: Recent Labs  Lab 01/19/21 1355 01/19/21 1611  LATICACIDVEN 2.1* 1.9    Recent Results (from the past 240 hour(s))  Blood Culture (routine x 2)     Status: None (Preliminary result)   Collection Time: 01/19/21  1:55 PM   Specimen: BLOOD  Result Value Ref Range Status   Specimen Description BLOOD RT FOOT  Final   Special Requests Blood Culture adequate volume  Final   Culture   Final    NO GROWTH < 24 HOURS Performed at [facility], [address] [place], [place] [address]    Report Status PENDING  Incomplete  Resp Panel by RT-PCR (Flu A&B, Covid) Nasopharyngeal Swab     Status: None   Collection  Time: 01/19/21  3:07 PM   Specimen: Nasopharyngeal Swab; Nasopharyngeal(NP) swabs in vial transport medium  Result Value Ref Range Status   SARS Coronavirus 2 by RT PCR NEGATIVE NEGATIVE Final    Comment: (NOTE) SARS-CoV-2 target nucleic acids are NOT DETECTED.  The SARS-CoV-2 RNA is generally detectable in upper respiratory specimens during the acute phase of infection. The lowest concentration of SARS-CoV-2 viral copies this assay can detect is 138 copies/mL. A negative result does not preclude SARS-Cov-2 infection and should not be used as the sole basis for treatment or other patient management decisions. A negative result may occur with  improper specimen collection/handling, submission of specimen other than nasopharyngeal swab, presence of viral mutation(s) within the areas targeted by this assay, and inadequate number of viral copies(<138 copies/mL). A negative result must be combined with clinical observations, patient history, and epidemiological information. The expected result is Negative.  Fact Sheet for Patients:  BloggerCourse.com  Fact Sheet  for Healthcare Providers:  SeriousBroker.it  This test is no t yet approved or cleared by the [place] FDA and  has been authorized for detection and/or diagnosis of SARS-CoV-2 by FDA under an Emergency Use Authorization (EUA). This EUA will remain  in effect (meaning this test can be used) for the duration of the COVID-19 declaration under Section 564(b)(1) of the Act, 21 U.S.C.section 360bbb-3(b)(1), unless the authorization is terminated  or revoked sooner.       Influenza A by PCR NEGATIVE NEGATIVE Final   Influenza B by PCR NEGATIVE NEGATIVE Final    Comment: (NOTE) The Xpert Xpress SARS-CoV-2/FLU/RSV plus assay is intended as an aid in the diagnosis of influenza from Nasopharyngeal swab specimens and should not be used as a sole basis for treatment. Nasal washings and aspirates are unacceptable for Xpert Xpress SARS-CoV-2/FLU/RSV testing.  Fact Sheet for Patients: BloggerCourse.com  Fact Sheet for Healthcare Providers: SeriousBroker.it  This test is not yet approved or cleared by the [place] FDA and has been authorized for detection and/or diagnosis of SARS-CoV-2 by FDA under an Emergency Use Authorization (EUA). This EUA will remain in effect (meaning this test can be used) for the duration of the COVID-19 declaration under Section 564(b)(1) of the Act, 21 U.S.C. section 360bbb-3(b)(1), unless the authorization is terminated or revoked.  Performed at [facility], [address] [place], [place] [address]   Blood Culture (routine x 2)     Status: None (Preliminary result)   Collection Time: 01/19/21  3:09 PM   Specimen: BLOOD  Result Value Ref Range Status   Specimen Description BLOOD  BLRA  Final   Special Requests   Final    Blood Culture results may not be optimal due to an inadequate volume of blood received in culture bottles   Culture  Setup Time   Final     Organism ID to follow IN BOTH AEROBIC AND ANAEROBIC BOTTLES GRAM POSITIVE COCCI CRITICAL RESULT CALLED TO, READ BACK BY AND VERIFIED WITH: ABBY ELLINGTON AT 0722 01/20/21.PMF Performed at [facility], [address] [place], [place] [address]    Culture [facility] POSITIVE COCCI  Final   Report Status PENDING  Incomplete  Blood Culture ID Panel (Reflexed)     Status: Abnormal   Collection Time: 01/19/21  3:09 PM  Result Value Ref Range Status   Enterococcus faecalis NOT DETECTED NOT DETECTED Final   Enterococcus Faecium NOT DETECTED NOT DETECTED Final   Listeria monocytogenes NOT DETECTED NOT DETECTED Final   Staphylococcus  species DETECTED (A) NOT DETECTED Final    Comment: CRITICAL RESULT CALLED TO, READ BACK BY AND VERIFIED WITH: ABBY ELLINGTON AT 0722 01/20/21.PMF    Staphylococcus aureus (BCID) DETECTED (A) NOT DETECTED Final    Comment: CRITICAL RESULT CALLED TO, READ BACK BY AND VERIFIED WITH: ABBY ELLINGTON AT 0722 01/20/21.PMF    Staphylococcus epidermidis NOT DETECTED NOT DETECTED Final   Staphylococcus lugdunensis NOT DETECTED NOT DETECTED Final   Streptococcus species NOT DETECTED NOT DETECTED Final   Streptococcus agalactiae NOT DETECTED NOT DETECTED Final   Streptococcus pneumoniae NOT DETECTED NOT DETECTED Final   Streptococcus pyogenes NOT DETECTED NOT DETECTED Final   A.calcoaceticus-baumannii NOT DETECTED NOT DETECTED Final   Bacteroides fragilis NOT DETECTED NOT DETECTED Final   Enterobacterales NOT DETECTED NOT DETECTED Final   Enterobacter cloacae complex NOT DETECTED NOT DETECTED Final   Escherichia coli NOT DETECTED NOT DETECTED Final   Klebsiella aerogenes NOT DETECTED NOT DETECTED Final   Klebsiella oxytoca NOT DETECTED NOT DETECTED Final   Klebsiella pneumoniae NOT DETECTED NOT DETECTED Final   Proteus species NOT DETECTED NOT DETECTED Final   Salmonella species NOT DETECTED NOT DETECTED Final   Serratia marcescens NOT DETECTED NOT DETECTED Final    Haemophilus influenzae NOT DETECTED NOT DETECTED Final   Neisseria meningitidis NOT DETECTED NOT DETECTED Final   Pseudomonas aeruginosa NOT DETECTED NOT DETECTED Final   Stenotrophomonas maltophilia NOT DETECTED NOT DETECTED Final   Candida albicans NOT DETECTED NOT DETECTED Final   Candida auris NOT DETECTED NOT DETECTED Final   Candida glabrata NOT DETECTED NOT DETECTED Final   Candida krusei NOT DETECTED NOT DETECTED Final   Candida parapsilosis NOT DETECTED NOT DETECTED Final   Candida tropicalis NOT DETECTED NOT DETECTED Final   Cryptococcus neoformans/gattii NOT DETECTED NOT DETECTED Final   Meth resistant mecA/C and MREJ NOT DETECTED NOT DETECTED Final    Comment: Performed at [facility], [address] [place], [place] [address]  Aerobic/Anaerobic Culture w Gram Stain (surgical/deep wound)     Status: None (Preliminary result)   Collection Time: 01/19/21  7:01 PM   Specimen: Wound  Result Value Ref Range Status   Specimen Description   Final    ABSCESS LOWER LEFT LEG Performed at [facility], [address], [place], [place] [address]    Special Requests   Final    NONE Performed at [facility], [address] [place], [place] [address]    Gram Stain   Final    RARE SQUAMOUS EPITHELIAL CELLS PRESENT FEW WBC SEEN FEW GRAM POSITIVE COCCI Performed at [facility], [address], [place], [place] [address]    Culture PENDING  Incomplete   Report Status PENDING  Incomplete         Radiology Studies: US Venous Img Lower Unilateral Left  Result Date: 01/19/2021 CLINICAL DATA:  IV drug use. Abscess or DVT. Swelling and redness of the left lower extremity. EXAM: Left LOWER EXTREMITY VENOUS DOPPLER ULTRASOUND TECHNIQUE: Gray-scale sonography with compression, as well as color and duplex ultrasound, were performed to evaluate the deep venous system(s) from the level of the common femoral vein through the popliteal and proximal calf  veins. COMPARISON:  None. FINDINGS: VENOUS Normal compressibility of the common femoral, superficial femoral, and popliteal veins, as well as the visualized calf veins. Visualized portions of profunda femoral vein and great saphenous vein unremarkable. No filling defects to suggest DVT on grayscale or color Doppler imaging. Doppler waveforms show normal  direction of venous flow, normal respiratory plasticity and response to augmentation. OTHER There is a 5.4 x 2.7 x 6 cm heterogeneous isoechoic fluid collection within the popliteal fossa that extends inferiorly to the proximal calf. Limitations: none IMPRESSION: 1. No left lower extremity deep venous thrombosis. 2. A 5.4 x 2.7 x 6 cm heterogeneous isoechoic fluid collection within the left popliteal fossa extends inferiorly to the proximal calf. Finding may represent a liquified hematoma versus abscess. Electronically Signed   By: Tish Frederickson M.D.   On: 01/19/2021 16:20   DG Chest Port 1 View  Result Date: 01/19/2021 CLINICAL DATA:  Questionable sepsis. EXAM: PORTABLE CHEST 1 VIEW COMPARISON:  None. FINDINGS: The heart, hila, and mediastinum are normal. No pneumothorax. No nodules or masses. No focal infiltrates. IMPRESSION: No active disease. Electronically Signed   By: Gerome Sam III M.D.   On: 01/19/2021 15:18   CT EXTREMITY LOWER LEFT W CONTRAST  Result Date: 01/19/2021 CLINICAL DATA:  Left leg infection.  History of IV drug use. EXAM: CT OF THE LOWER LEFT EXTREMITY WITH CONTRAST TECHNIQUE: Multidetector CT imaging of the lower left extremity was performed according to the standard protocol following intravenous contrast administration. CONTRAST:  75mL OMNIPAQUE IOHEXOL 350 MG/ML SOLN COMPARISON:  Left lower extremity ultrasound from same day. FINDINGS: Bones/Joint/Cartilage No fracture or dislocation. Joint spaces are preserved. No joint effusion. Ligaments Ligaments are suboptimally evaluated by CT. Muscles and Tendons Grossly intact. Soft  tissue Ill-defined partially rim enhancing fluid collection involving the posteromedial knee, measuring approximately 8.4 x 3.2 x 12.6 cm (series 5, image 303; series 8, image 86). The fluid collection involves the deep compartment between the pes anserine tendons and semimembranosus tendon with extension into the proximal lower leg along the medial gastrocnemius muscle. No subcutaneous emphysema. Scattered soft tissue fat stranding and fluid extending from the mid thigh to the ankle. Circumferential skin thickening of the lower leg. No soft tissue mass.  Reactive left inguinal lymphadenopathy. IMPRESSION: 1. Ill-defined abscess of the posteromedial knee, measuring approximately 8.4 x 3.2 x 12.6 cm. The fluid collection involves the deep compartment between the pes anserine tendons and semimembranosus tendon with extension into the proximal lower leg along the medial gastrocnemius muscle. 2. Scattered soft tissue fat stranding and fluid extending from the mid thigh to the ankle, consistent with cellulitis. No subcutaneous emphysema. 3. No acute osseous abnormality. Electronically Signed   By: Obie Dredge M.D.   On: 01/19/2021 17:28        Scheduled Meds:  enoxaparin (LOVENOX) injection  40 mg Subcutaneous Q24H   ketorolac  30 mg Intravenous Q6H   nicotine  21 mg Transdermal Daily   Continuous Infusions:  ceFEPime (MAXIPIME) IV 2 g (01/20/21 0536)   metronidazole 500 mg (01/20/21 1102)   vancomycin       LOS: 1 day    Time spent: 35 minutes    Tresa Moore, MD [facility] Pager [phone]  If 7PM-7AM, please contact night-coverage 01/20/2021, 12:22 PM

## 2021-01-20 NOTE — Addendum Note (Signed)
Addendum  created 01/20/21 0001 by Irving Burton, CRNA   Charge Capture section accepted, Visit diagnoses modified

## 2021-01-20 NOTE — Consult Note (Signed)
Pharmacy Antibiotic Note  Cindy Cohen is a 30 y.o. female admitted on 01/19/2021 with left lower extremity abscess and bilateral forearm wounds.  Pharmacy has been consulted for cefazolin dosing.   Imaging negative for osseous abnormality or gas within the fluid and no subcutaneous emphysema. Patient with hx of IV drug use.   BCID resulted S. Aureus without methicillin resistance. Per ID, plan is to deescalate therapy.   Plan: Discontinue cefepime and vancomycin. Start cefazolin 2 grams every 8 hours. Monitor cultures and sensitivities, WBC, and renal function.   Height: 4' 11.02" (149.9 cm) Weight: 59 kg (130 lb) IBW/kg (Calculated) : 43.24  Temp (24hrs), Avg:97.8 F (36.6 C), Min:96.9 F (36.1 C), Max:98.3 F (36.8 C)  Recent Labs  Lab 01/19/21 1355 01/19/21 1611 01/20/21 0510  WBC 24.8*  --  25.2*  CREATININE 0.79  --  0.73  LATICACIDVEN 2.1* 1.9  --      Estimated Creatinine Clearance: 81.8 mL/min (by C-G formula based on SCr of 0.73 mg/dL).    No Known Allergies  Antimicrobials this admission: 8/27 vancomycin >>  8/27 cefepime >>  8/27 metronidazole  Dose adjustments this admission: N/a  Microbiology results: 8/27 BCx: gram positive coci; BCID positive for MSSA (MecA/C not detected) 8/27 wound Cx: few gram positive cocci 8/27 UCx: E. coli 8/27 MRSA PCR: sent  Thank you for allowing pharmacy to be a part of this patient's care.  Jaynie Bream, PharmD Pharmacy Resident  01/20/2021 3:29 PM

## 2021-01-20 NOTE — Progress Notes (Signed)
01/20/2021  Subjective: Patient is 1 Day Post-Op I&D of left lower extremity abscess and debridement of bilateral forearm wounds.  No acute events overnight.  Patient reports her pain is well controlled, but mostly reports issues with itching, particularly in the left lower leg.    Vital signs: Temp:  [96.9 F (36.1 C)-98.7 F (37.1 C)] 98.3 F (36.8 C) (08/28 0842) Pulse Rate:  [88-104] 102 (08/28 0842) Resp:  [9-20] 16 (08/28 0842) BP: (105-157)/(55-104) 126/104 (08/28 0842) SpO2:  [93 %-100 %] 97 % (08/28 0842) Weight:  [59 kg] 59 kg (08/27 1349)   Intake/Output: 08/27 0701 - 08/28 0700 In: 3144.4 [P.O.:240; I.V.:1604.4; IV Piggyback:1300] Out: 805 [Urine:800; Blood:5]    Physical Exam: Constitutional: No acute distress Extremities:  bilateral forearms and left lower extremity with OR dressing in place, not removed this morning.  The forearm dressings appear clean, dry, intact.  The left lower extremity dressing has serosanguinous staining.  Labs:  Recent Labs    01/19/21 1355 01/20/21 0510  WBC 24.8* 25.2*  HGB 8.5* 7.7*  HCT 26.5* 23.7*  PLT 443* 402*   Recent Labs    01/19/21 1355 01/20/21 0510  NA 140 138  K 4.8 4.5  CL 107 107  CO2 24 24  GLUCOSE 156* 188*  BUN 18 12  CREATININE 0.79 0.73  CALCIUM 8.4* 7.8*   Recent Labs    01/19/21 1438  LABPROT 14.5  INR 1.1    Imaging: US Venous Img Lower Unilateral Left  Result Date: 01/19/2021 CLINICAL DATA:  IV drug use. Abscess or DVT. Swelling and redness of the left lower extremity. EXAM: Left LOWER EXTREMITY VENOUS DOPPLER ULTRASOUND TECHNIQUE: Gray-scale sonography with compression, as well as color and duplex ultrasound, were performed to evaluate the deep venous system(s) from the level of the common femoral vein through the popliteal and proximal calf veins. COMPARISON:  None. FINDINGS: VENOUS Normal compressibility of the common femoral, superficial femoral, and popliteal veins, as well as the  visualized calf veins. Visualized portions of profunda femoral vein and great saphenous vein unremarkable. No filling defects to suggest DVT on grayscale or color Doppler imaging. Doppler waveforms show normal direction of venous flow, normal respiratory plasticity and response to augmentation. OTHER There is a 5.4 x 2.7 x 6 cm heterogeneous isoechoic fluid collection within the popliteal fossa that extends inferiorly to the proximal calf. Limitations: none IMPRESSION: 1. No left lower extremity deep venous thrombosis. 2. A 5.4 x 2.7 x 6 cm heterogeneous isoechoic fluid collection within the left popliteal fossa extends inferiorly to the proximal calf. Finding may represent a liquified hematoma versus abscess. Electronically Signed   By: Tish Frederickson M.D.   On: 01/19/2021 16:20   DG Chest Port 1 View  Result Date: 01/19/2021 CLINICAL DATA:  Questionable sepsis. EXAM: PORTABLE CHEST 1 VIEW COMPARISON:  None. FINDINGS: The heart, hila, and mediastinum are normal. No pneumothorax. No nodules or masses. No focal infiltrates. IMPRESSION: No active disease. Electronically Signed   By: Gerome Sam III M.D.   On: 01/19/2021 15:18   CT EXTREMITY LOWER LEFT W CONTRAST  Result Date: 01/19/2021 CLINICAL DATA:  Left leg infection.  History of IV drug use. EXAM: CT OF THE LOWER LEFT EXTREMITY WITH CONTRAST TECHNIQUE: Multidetector CT imaging of the lower left extremity was performed according to the standard protocol following intravenous contrast administration. CONTRAST:  100mL OMNIPAQUE IOHEXOL 350 MG/ML SOLN COMPARISON:  Left lower extremity ultrasound from same day. FINDINGS: Bones/Joint/Cartilage No fracture or dislocation. Joint  spaces are preserved. No joint effusion. Ligaments Ligaments are suboptimally evaluated by CT. Muscles and Tendons Grossly intact. Soft tissue Ill-defined partially rim enhancing fluid collection involving the posteromedial knee, measuring approximately 8.4 x 3.2 x 12.6 cm (series 5,  image 303; series 8, image 86). The fluid collection involves the deep compartment between the pes anserine tendons and semimembranosus tendon with extension into the proximal lower leg along the medial gastrocnemius muscle. No subcutaneous emphysema. Scattered soft tissue fat stranding and fluid extending from the mid thigh to the ankle. Circumferential skin thickening of the lower leg. No soft tissue mass.  Reactive left inguinal lymphadenopathy. IMPRESSION: 1. Ill-defined abscess of the posteromedial knee, measuring approximately 8.4 x 3.2 x 12.6 cm. The fluid collection involves the deep compartment between the pes anserine tendons and semimembranosus tendon with extension into the proximal lower leg along the medial gastrocnemius muscle. 2. Scattered soft tissue fat stranding and fluid extending from the mid thigh to the ankle, consistent with cellulitis. No subcutaneous emphysema. 3. No acute osseous abnormality. Electronically Signed   By: Obie Dredge M.D.   On: 01/19/2021 17:28    Assessment/Plan: This is a 30 y.o. female s/p I&D of left lower extermity abscess and debridement of bilateral forearm wounds.  --Patient is currently doing well, pain appears well controlled.  Cultures from abscess showing GPC.  She has staph aureus bacteremia based on blood cultures.  Recommend continuing broad spectrum antibiotics for now while waiting on further culture data.   --Dressing change orders in place. --Elevate left leg with pillows --Monitor for possible signs of drug withdrawal. --Will continue to follow.   Howie Ill, MD [facility]

## 2021-01-21 ENCOUNTER — Inpatient Hospital Stay (HOSPITAL_COMMUNITY)
Admit: 2021-01-21 | Discharge: 2021-01-21 | Disposition: A | Discharge status: Home / Self Care | Payer: Self-pay | Attending: Internal Medicine | Admitting: Internal Medicine

## 2021-01-21 DIAGNOSIS — R7881 Bacteremia: Secondary | ICD-10-CM

## 2021-01-21 DIAGNOSIS — B9561 Methicillin susceptible Staphylococcus aureus infection as the cause of diseases classified elsewhere: Secondary | ICD-10-CM

## 2021-01-21 LAB — ECHOCARDIOGRAM COMPLETE
AR max vel: 2.12 cm2
AV Area VTI: 2.64 cm2
AV Area mean vel: 2.24 cm2
AV Mean grad: 6.5 mmHg
AV Peak grad: 12.5 mmHg
Ao pk vel: 1.77 m/s
Area-P 1/2: 7.44 cm2
Height: 59.016 in
S' Lateral: 2.82 cm
Weight: 2080 oz

## 2021-01-21 LAB — CBC
HCT: 27.2 % — ABNORMAL LOW (ref 36.0–46.0)
Hemoglobin: 8.8 g/dL — ABNORMAL LOW (ref 12.0–15.0)
MCH: 24.3 pg — ABNORMAL LOW (ref 26.0–34.0)
MCHC: 32.4 g/dL (ref 30.0–36.0)
MCV: 75.1 fL — ABNORMAL LOW (ref 80.0–100.0)
Platelets: 358 10*3/uL (ref 150–400)
RBC: 3.62 MIL/uL — ABNORMAL LOW (ref 3.87–5.11)
RDW: 17.3 % — ABNORMAL HIGH (ref 11.5–15.5)
WBC: 23 10*3/uL — ABNORMAL HIGH (ref 4.0–10.5)
nRBC: 0.1 % (ref 0.0–0.2)

## 2021-01-21 LAB — URINE CULTURE: Culture: >=100000 — AB

## 2021-01-21 MED ORDER — METRONIDAZOLE 500 MG/100ML IV SOLN
500.0000 mg | Freq: Two times a day (BID) | INTRAVENOUS | Status: DC
  Administered 2021-01-21 – 2021-01-23 (×4): 500 mg via INTRAVENOUS
  Filled 2021-01-21 (×6): qty 100

## 2021-01-21 NOTE — Plan of Care (Signed)
  Problem: Clinical Measurements: Goal: Ability to maintain clinical measurements within normal limits will improve Outcome: Progressing Goal: Will remain free from infection Outcome: Progressing Goal: Diagnostic test results will improve Outcome: Progressing Goal: Respiratory complications will improve Outcome: Progressing Goal: Cardiovascular complication will be avoided Outcome: Progressing   Problem: Pain Managment: Goal: General experience of comfort will improve Outcome: Progressing   Pt is calm and cooperative. V/S stable. Reports pain on her left leg; scheduled toradol given with relief. Dressing on bil ext and left leg; changed.

## 2021-01-21 NOTE — Progress Notes (Signed)
[facility] SURGICAL PROGRESS NOTE  Hospital Day(s): 2.   Post op day(s): 2 Days Post-Op.   Interval History:  Patient seen and examined No acute events or new complaints overnight.  Patient reports she is feeling better and her erythema and swelling to her LLE are improving NO fever, chills Leukocytosis only making small improvements; down to 23.0K (from 25.2K) Cx growing few GPC; switched to Cefazolin late last night Also with E coli UTI Tolerating diet  Vital signs in last 24 hours: [min-max] current  Temp:  [97.8 F (36.6 C)-98.3 F (36.8 C)] 97.8 F (36.6 C) (08/29 0451) Pulse Rate:  [97-106] 97 (08/29 0451) Resp:  [14-20] 16 (08/29 0451) BP: (126-146)/(79-104) 127/92 (08/29 0451) SpO2:  [95 %-100 %] 95 % (08/29 0451)     Height: 4' 11.02" (149.9 cm) Weight: 59 kg BMI (Calculated): 26.24   Intake/Output last 2 shifts:  No intake/output data recorded.   Physical Exam:  Constitutional: alert, cooperative and no distress  Respiratory: breathing non-labored at rest  Cardiovascular: regular rate and sinus rhythm  Integumentary: I&D to the left lower extremity to the medial and posterior aspect of the knee with 4 penrose drains in place, induration, swelling and erythema are all markedly improved. Bilateral chronic non-healing wounds to the bilateral forearms, tissues appear healthy, no erythema or drainage   Labs:  CBC Latest Ref Rng & Units 01/21/2021 01/20/2021 01/19/2021  WBC 4.0 - 10.5 K/uL 23.0(H) 25.2(H) 24.8(H)  Hemoglobin 12.0 - 15.0 g/dL 7.8(H) 7.7(L) 8.5(L)  Hematocrit 36.0 - 46.0 % 27.2(L) 23.7(L) 26.5(L)  Platelets 150 - 400 K/uL 358 402(H) 443(H)   CMP Latest Ref Rng & Units 01/20/2021 01/19/2021  Glucose 70 - 99 mg/dL 885(O) 277(A)  BUN 6 - 20 mg/dL 12 18  Creatinine 1.28 - 1.00 mg/dL 7.86 7.67  Sodium 209 - 145 mmol/L 138 140  Potassium 3.5 - 5.1 mmol/L 4.5 4.8  Chloride 98 - 111 mmol/L 107 107  CO2 22 - 32 mmol/L 24 24  Calcium 8.9 - 10.3  mg/dL 7.8(L) 8.4(L)  Total Protein 6.5 - 8.1 g/dL - 6.3(L)  Total Bilirubin 0.3 - 1.2 mg/dL - 4.7(S)  Alkaline Phos 38 - 126 U/L - 110  AST 15 - 41 U/L - 15  ALT 0 - 44 U/L - 13     Imaging studies: No new pertinent imaging studies   Assessment/Plan:  30 y.o. female 2 Days Post-Op s/p incision and drainage of left lower extremity abscess and debridement of bilateral lower extremity wounds   - Will need to broaden Abx to cover UTI as well - Continue local wound care:  - LLE I&D: Cover with with dry gauze, secure with Kerlix, to be completed daily - Left Forearm Wound: Pack wound bed with saline moistened gauze, cover with dry gauze, secure with tape or Kerlix, to be completed BID -  Right Forearm Wound: Pack wound bed with saline moistened gauze, cover with dry gauze, secure with tape or Kerlix, to be completed BID    - Pain control prn   - Mobilization as tolerated - Keep LLE elevated   - Further management per primary service; we will follow  - Discharge Planning; She will benefit from additional IV Abx; follow up Cx to narrow Abx for home, hopefully home tomorrow   All of the above findings and recommendations were discussed with the patient, and the medical team, and all of patient's questions were answered to her expressed satisfaction.  -- Lynden Oxford, PA-C  [facility] 01/21/2021, 7:25 AM [phone] M-F: 7am - 4pm

## 2021-01-21 NOTE — Consult Note (Signed)
NAME: Cindy Cohen  DOB: [birth year]  MRN: [id number]  Date/Time: 01/21/2021 11:33 AM  REQUESTING PROVIDER: Dr.[email or web address] Subjective:  REASON FOR CONSULT: MSSa bacteremia ? Cindy Cohen is a 30 y.o. female with a history of IVDA Presents with left leg swelling and what she thought was insect bite. 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 In the ED vitals on 8/27 were 126/89, TEMp 97.6, PULSE 88 and sats 99%. WBC 24.8, HB 8.5, PLT 443 [place] leg ruled out DVT but showed a 5.4 x 2.7 x 6 cm heterogeneous isoechoic fluid collection within the popliteal fossa that extends inferiorly to the proximal calf.Blood culture sent and she was started on Vanco/cefepime and flagyl. She was taken for I/D of the left leg abscess and debridement of the arms wounds. Incision and Drainage of deep subcutaneous and subfascial left lower extremity abscess Debridement of bilateral forearm chronic wounds, each approximately 40 cm2 on 01/19/21. As blood culture was MSSA her antibiotics deescalated to cefazolin I am seeing the patient for the same  Past Medical History:  Diagnosis Date   IV drug user     Past Surgical History:  Procedure Laterality Date   I & D EXTREMITY Bilateral 01/19/2021   Procedure: IRRIGATION AND DEBRIDEMENT EXTREMITY BILATERAL ARMS;  Surgeon: Henrene Dodge, MD;  Location: [facility] ORS;  Service: General;  Laterality: Bilateral;   INCISION AND DRAINAGE OF WOUND Left 01/19/2021   Procedure: IRRIGATION AND DEBRIDEMENT WOUND LEFT LOWER LEG;  Surgeon: Henrene Dodge, MD;  Location: [facility] OR[facility];  Service: General;  Laterality: Left;    Social History   Socioeconomic History   Marital status: Single    Spouse name: Not on file   Number of children: Not on file   Years of education: Not on file   Highest education level: Not on file  Occupational History   Not on file  Tobacco Use   Smoking  status: Every Day    Packs/day: 0.50    Types: Cigarettes   Smokeless tobacco: Never  Substance and Sexual Activity   Alcohol use: Yes   Drug use: Yes    Types: IV, "Crack" cocaine   Sexual activity: Yes  Other Topics Concern   Not on file  Social History Narrative   Not on file   Social Determinants of Health   Financial Resource Strain: Not on file  Food Insecurity: Not on file  Transportation Needs: Not on file  Physical Activity: Not on file  Stress: Not on file  Social Connections: Not on file  Intimate Partner Violence: Not on file    History reviewed. No pertinent family history. No Known Allergies I? Current Facility-Administered Medications  Medication Dose Route Frequency Provider Last Rate Last Admin   acetaminophen (TYLENOL) tablet 1,000 mg  1,000 mg Oral Q6H PRN Piscoya, Jose, MD       ceFAZolin (ANCEF) IVPB 2g/100 mL premix  2 g Intravenous Q8H Cordella Register A, RPH 200 mL/hr at 01/21/21 0516 2 g at 01/21/21 0516   diphenhydrAMINE (BENADRYL) 12.5 MG/5ML elixir 12.5 mg  12.5 mg Oral Q6H PRN Piscoya, Jose, MD   12.5 mg at 01/21/21 1119   enoxaparin (LOVENOX) injection 40 mg  40 mg Subcutaneous Q24H Sreenath, Sudheer B, MD   40 mg at 01/20/21 1240   HYDROmorphone (DILAUDID) injection 0.5 mg  0.5 mg Intravenous Q4H PRN Piscoya,  Jose, MD   0.5 mg at 01/21/21 1108   ketorolac (TORADOL) 30 MG/ML injection 30 mg  30 mg Intravenous Q6H Piscoya, Jose, MD   30 mg at 01/21/21 0511   LORazepam (ATIVAN) injection 1 mg  1 mg Intravenous Q1H PRN Mansy, Jan A, MD   1 mg at 01/21/21 1119   nicotine (NICODERM CQ - dosed in mg/24 hours) patch 21 mg  21 mg Transdermal Daily Lolita Patella B, MD   21 mg at 01/21/21 0854   nicotine polacrilex (NICORETTE) gum 2 mg  2 mg Oral PRN Lolita Patella B, MD   2 mg at 01/20/21 1102   ondansetron (ZOFRAN) tablet 4 mg  4 mg Oral Q6H PRN Piscoya, Elita Quick, MD       Or   ondansetron (ZOFRAN) injection 4 mg  4 mg Intravenous Q6H PRN Piscoya,  Jose, MD       oxyCODONE (Oxy IR/ROXICODONE) immediate release tablet 5-10 mg  5-10 mg Oral Q4H PRN Henrene Dodge, MD   10 mg at 01/21/21 9629     Abtx:  Anti-infectives (From admission, onward)    Start     Dose/Rate Route Frequency Ordered Stop   01/20/21 1800  vancomycin (VANCOREADY) IVPB 1250 mg/250 mL  Status:  Discontinued        1,250 mg 166.7 mL/hr over 90 Minutes Intravenous Every 24 hours 01/19/21 1916 01/20/21 1234   01/20/21 1400  ceFAZolin (ANCEF) IVPB 2g/100 mL premix        2 g 200 mL/hr over 30 Minutes Intravenous Every 8 hours 01/20/21 1243     01/20/21 0815  metroNIDAZOLE (FLAGYL) IVPB 500 mg  Status:  Discontinued        500 mg 100 mL/hr over 60 Minutes Intravenous Every 8 hours 01/20/21 0726 01/20/21 1234   01/19/21 2300  ceFEPIme (MAXIPIME) 2 g in sodium chloride 0.9 % 100 mL IVPB  Status:  Discontinued        2 g 200 mL/hr over 30 Minutes Intravenous Every 8 hours 01/19/21 1846 01/20/21 1234   01/19/21 2230  vancomycin (VANCOREADY) IVPB 500 mg/100 mL        500 mg 100 mL/hr over 60 Minutes Intravenous  Once 01/19/21 1916 01/20/21 0036   01/19/21 1500  ceFEPIme (MAXIPIME) 2 g in sodium chloride 0.9 % 100 mL IVPB        2 g 200 mL/hr over 30 Minutes Intravenous  Once 01/19/21 1448 01/19/21 1544   01/19/21 1500  metroNIDAZOLE (FLAGYL) IVPB 500 mg        500 mg 100 mL/hr over 60 Minutes Intravenous  Once 01/19/21 1448 01/19/21 1635   01/19/21 1500  vancomycin (VANCOCIN) IVPB 1000 mg/200 mL premix        1,000 mg 200 mL/hr over 60 Minutes Intravenous  Once 01/19/21 1448 01/19/21 1814       REVIEW OF SYSTEMS:  Const: negative fever, negative chills, negative weight loss Eyes: negative diplopia or visual changes, negative eye pain ENT: negative coryza, negative sore throat Resp: negative cough, hemoptysis, dyspnea Cards: negative for chest pain, palpitations, lower extremity edema GU: negative for frequency, dysuria and hematuria GI: Negative for abdominal  pain, diarrhea, bleeding, constipation Skin: negative for rash and pruritus Heme: negative for easy bruising and gum/nose bleeding MS: weakness. Left leg pain Neurolo:negative for headaches, dizziness, vertigo, memory problems  Psych: negative for feelings of anxiety, depression  Endocrine: negative for thyroid, diabetes Allergy/Immunology- negative for any medication or food allergies ?  VITALS:  Objective:   BP (!) 127/92 (BP Location: Right Leg)   Pulse 97   Temp 97.8 F (36.6 C) (Oral)   Resp 16   Ht 4' 11.02" (1.499 m)   Wt 59 kg   LMP 01/16/2021   SpO2 95%   BMI 26.24 kg/m  PHYSICAL EXAM:  General: Alert, cooperative, no distress, appears stated age.  Head: Normocephalic, without obvious abnormality, atraumatic. Eyes: Conjunctivae clear, anicteric sclerae. Pupils are equal ENT Nares normal. No drainage or sinus tenderness. Lips, mucosa, and tongue normal. No Thrush Neck: Supple, symmetrical, no adenopathy, thyroid: non tender no carotid bruit and no JVD. Back: No CVA tenderness. Lungs: Clear to auscultation bilaterally. No Wheezing or Rhonchi. No rales. Heart: Regular rate and rhythm, 2/6 systolic murmur mitral area. Abdomen: Soft, non-tender,not distended. Bowel sounds normal. No masses Extremities: atraumatic, no cyanosis. No edema. No clubbing Skin:        Both arms picturs reviewed Nowdressing in place Lymph: Cervical, supraclavicular normal. Neurologic: Grossly non-focal Pertinent Labs Lab Results CBC    Component Value Date/Time   WBC 23.0 (H) 01/21/2021 0429   RBC 3.62 (L) 01/21/2021 0429   HGB 8.8 (L) 01/21/2021 0429   HCT 27.2 (L) 01/21/2021 0429   PLT 358 01/21/2021 0429   MCV 75.1 (L) 01/21/2021 0429   MCH 24.3 (L) 01/21/2021 0429   MCHC 32.4 01/21/2021 0429   RDW 17.3 (H) 01/21/2021 0429   LYMPHSABS 1.7 01/19/2021 1355   MONOABS 1.2 (H) 01/19/2021 1355   EOSABS 0.6 (H) 01/19/2021 1355   BASOSABS 0.1 01/19/2021 1355    CMP Latest  Ref Rng & Units 01/20/2021 01/19/2021  Glucose 70 - 99 mg/dL 017(P) 102(H)  BUN 6 - 20 mg/dL 12 18  Creatinine 8.52 - 1.00 mg/dL 7.78 2.42  Sodium 353 - 145 mmol/L 138 140  Potassium 3.5 - 5.1 mmol/L 4.5 4.8  Chloride 98 - 111 mmol/L 107 107  CO2 22 - 32 mmol/L 24 24  Calcium 8.9 - 10.3 mg/dL 7.8(L) 8.4(L)  Total Protein 6.5 - 8.1 g/dL - 6.3(L)  Total Bilirubin 0.3 - 1.2 mg/dL - 6.1(W)  Alkaline Phos 38 - 126 U/L - 110  AST 15 - 41 U/L - 15  ALT 0 - 44 U/L - 13      Microbiology: Recent Results (from the past 240 hour(s))  Blood Culture (routine x 2)     Status: None (Preliminary result)   Collection Time: 01/19/21  1:55 PM   Specimen: BLOOD  Result Value Ref Range Status   Specimen Description BLOOD RT FOOT  Final   Special Requests Blood Culture adequate volume  Final   Culture   Final    NO GROWTH 2 DAYS Performed at [facility], [address] [place], [place] [address]    Report Status PENDING  Incomplete  Urine Culture     Status: Abnormal   Collection Time: 01/19/21  2:41 PM   Specimen: Urine, Clean Catch  Result Value Ref Range Status   Specimen Description   Final    URINE, CLEAN CATCH Performed at [facility], [address], [place], [place] [address]    Special Requests   Final    NONE Performed at [facility], [address] [place], [place] [address]    Culture >=100,000 COLONIES/mL ESCHERICHIA COLI (A)  Final   Report Status 01/21/2021 FINAL  Final   Organism ID, Bacteria ESCHERICHIA COLI (A)  Final      Susceptibility  Escherichia coli - MIC*    AMPICILLIN 8 SENSITIVE Sensitive     CEFAZOLIN <=4 SENSITIVE Sensitive     CEFEPIME <=0.12 SENSITIVE Sensitive     CEFTRIAXONE <=0.25 SENSITIVE Sensitive     CIPROFLOXACIN <=0.25 SENSITIVE Sensitive     GENTAMICIN <=1 SENSITIVE Sensitive     IMIPENEM <=0.25 SENSITIVE Sensitive     NITROFURANTOIN <=16 SENSITIVE Sensitive     TRIMETH/SULFA <=20 SENSITIVE Sensitive      AMPICILLIN/SULBACTAM <=2 SENSITIVE Sensitive     PIP/TAZO <=4 SENSITIVE Sensitive     * >=100,000 COLONIES/mL ESCHERICHIA COLI  Resp Panel by RT-PCR (Flu A&B, Covid) Nasopharyngeal Swab     Status: None   Collection Time: 01/19/21  3:07 PM   Specimen: Nasopharyngeal Swab; Nasopharyngeal(NP) swabs in vial transport medium  Result Value Ref Range Status   SARS Coronavirus 2 by RT PCR NEGATIVE NEGATIVE Final    Comment: (NOTE) SARS-CoV-2 target nucleic acids are NOT DETECTED.  The SARS-CoV-2 RNA is generally detectable in upper respiratory specimens during the acute phase of infection. The lowest concentration of SARS-CoV-2 viral copies this assay can detect is 138 copies/mL. A negative result does not preclude SARS-Cov-2 infection and should not be used as the sole basis for treatment or other patient management decisions. A negative result may occur with  improper specimen collection/handling, submission of specimen other than nasopharyngeal swab, presence of viral mutation(s) within the areas targeted by this assay, and inadequate number of viral copies(<138 copies/mL). A negative result must be combined with clinical observations, patient history, and epidemiological information. The expected result is Negative.  Fact Sheet for Patients:  BloggerCourse.com  Fact Sheet for Healthcare Providers:  SeriousBroker.it  This test is no t yet approved or cleared by the [place] FDA and  has been authorized for detection and/or diagnosis of SARS-CoV-2 by FDA under an Emergency Use Authorization (EUA). This EUA will remain  in effect (meaning this test can be used) for the duration of the COVID-19 declaration under Section 564(b)(1) of the Act, 21 U.S.C.section 360bbb-3(b)(1), unless the authorization is terminated  or revoked sooner.       Influenza A by PCR NEGATIVE NEGATIVE Final   Influenza B by PCR NEGATIVE NEGATIVE Final     Comment: (NOTE) The Xpert Xpress SARS-CoV-2/FLU/RSV plus assay is intended as an aid in the diagnosis of influenza from Nasopharyngeal swab specimens and should not be used as a sole basis for treatment. Nasal washings and aspirates are unacceptable for Xpert Xpress SARS-CoV-2/FLU/RSV testing.  Fact Sheet for Patients: BloggerCourse.com  Fact Sheet for Healthcare Providers: SeriousBroker.it  This test is not yet approved or cleared by the [place] FDA and has been authorized for detection and/or diagnosis of SARS-CoV-2 by FDA under an Emergency Use Authorization (EUA). This EUA will remain in effect (meaning this test can be used) for the duration of the COVID-19 declaration under Section 564(b)(1) of the Act, 21 U.S.C. section 360bbb-3(b)(1), unless the authorization is terminated or revoked.  Performed at [facility], [address] [place], [place] [address]   Blood Culture (routine x 2)     Status: Abnormal (Preliminary result)   Collection Time: 01/19/21  3:09 PM   Specimen: BLOOD  Result Value Ref Range Status   Specimen Description   Final    BLOOD  BLRA Performed at [facility], [address], [place], [place] [address]    Special Requests   Final    Blood Culture results may not be  optimal due to an inadequate volume of blood received in culture bottles Performed at [facility], [address] [place], [place] [address]    Culture  Setup Time   Final    Organism ID to follow IN BOTH AEROBIC AND ANAEROBIC BOTTLES GRAM POSITIVE COCCI CRITICAL RESULT CALLED TO, READ BACK BY AND VERIFIED WITH: ABBY ELLINGTON AT 0722 01/20/21.PMF Performed at [facility], [address] [place], [place] [address]    Culture (A)  Final    STAPHYLOCOCCUS AUREUS SUSCEPTIBILITIES TO FOLLOW Performed at [facility], [address], [place], [place] [address]    Report Status  PENDING  Incomplete  Blood Culture ID Panel (Reflexed)     Status: Abnormal   Collection Time: 01/19/21  3:09 PM  Result Value Ref Range Status   Enterococcus faecalis NOT DETECTED NOT DETECTED Final   Enterococcus Faecium NOT DETECTED NOT DETECTED Final   Listeria monocytogenes NOT DETECTED NOT DETECTED Final   Staphylococcus species DETECTED (A) NOT DETECTED Final    Comment: CRITICAL RESULT CALLED TO, READ BACK BY AND VERIFIED WITH: ABBY ELLINGTON AT 0722 01/20/21.PMF    Staphylococcus aureus (BCID) DETECTED (A) NOT DETECTED Final    Comment: CRITICAL RESULT CALLED TO, READ BACK BY AND VERIFIED WITH: ABBY ELLINGTON AT 0722 01/20/21.PMF    Staphylococcus epidermidis NOT DETECTED NOT DETECTED Final   Staphylococcus lugdunensis NOT DETECTED NOT DETECTED Final   Streptococcus species NOT DETECTED NOT DETECTED Final   Streptococcus agalactiae NOT DETECTED NOT DETECTED Final   Streptococcus pneumoniae NOT DETECTED NOT DETECTED Final   Streptococcus pyogenes NOT DETECTED NOT DETECTED Final   A.calcoaceticus-baumannii NOT DETECTED NOT DETECTED Final   Bacteroides fragilis NOT DETECTED NOT DETECTED Final   Enterobacterales NOT DETECTED NOT DETECTED Final   Enterobacter cloacae complex NOT DETECTED NOT DETECTED Final   Escherichia coli NOT DETECTED NOT DETECTED Final   Klebsiella aerogenes NOT DETECTED NOT DETECTED Final   Klebsiella oxytoca NOT DETECTED NOT DETECTED Final   Klebsiella pneumoniae NOT DETECTED NOT DETECTED Final   Proteus species NOT DETECTED NOT DETECTED Final   Salmonella species NOT DETECTED NOT DETECTED Final   Serratia marcescens NOT DETECTED NOT DETECTED Final   Haemophilus influenzae NOT DETECTED NOT DETECTED Final   Neisseria meningitidis NOT DETECTED NOT DETECTED Final   Pseudomonas aeruginosa NOT DETECTED NOT DETECTED Final   Stenotrophomonas maltophilia NOT DETECTED NOT DETECTED Final   Candida albicans NOT DETECTED NOT DETECTED Final   Candida auris NOT  DETECTED NOT DETECTED Final   Candida glabrata NOT DETECTED NOT DETECTED Final   Candida krusei NOT DETECTED NOT DETECTED Final   Candida parapsilosis NOT DETECTED NOT DETECTED Final   Candida tropicalis NOT DETECTED NOT DETECTED Final   Cryptococcus neoformans/gattii NOT DETECTED NOT DETECTED Final   Meth resistant mecA/C and MREJ NOT DETECTED NOT DETECTED Final    Comment: Performed at [facility], [address] [place], [place] [address]  Aerobic/Anaerobic Culture w Gram Stain (surgical/deep wound)     Status: None (Preliminary result)   Collection Time: 01/19/21  7:01 PM   Specimen: Wound  Result Value Ref Range Status   Specimen Description   Final    ABSCESS LOWER LEFT LEG Performed at [facility], [address], [place], [place] [address]    Special Requests   Final    NONE Performed at [facility], [address], [place], [place] [address]    Gram Stain   Final  RARE SQUAMOUS EPITHELIAL CELLS PRESENT FEW WBC SEEN FEW GRAM POSITIVE COCCI Performed at [facility], [address] [place], [place] [id number]    Culture PENDING  Incomplete   Report Status PENDING  Incomplete    IMAGING RESULTS: US leg reviewed I have personally reviewed the films ? Impression/Recommendation ?MSSA bacteremia secondary to soft tissue infection with IVDA ?s/p I/D Need repeat blood culture, 2 d echo'because of systolic murmur need TEE  B/l forearm wounds- has some necrosis- looks like it could be from black tar heroin which she did a month ago she says. Has been debrided. Will add flagyl and continue cefazolin  ? __HIV test sent  pending' will check for hepc  _________________________________________________ Discussed with patient, requesting provider Note:  This document was prepared using Dragon voice recognition software and may include unintentional dictation errors.

## 2021-01-21 NOTE — Progress Notes (Signed)
Chaplain Maggie made initial visit to introduce pastoral care as music from the patient's room drew chaplain into the space. Patient was adjusting her bedding in an attempt to make herself more comfortable. Pt noted music helps her to feel better. Chaplain agreed and shared music helps the chaplain too. Continued spiritual care available per on call chaplain.

## 2021-01-21 NOTE — Progress Notes (Signed)
PROGRESS NOTE    Cindy Cohen  INO:[id number] DOB: 1991-05-07 DOA: 01/19/2021 PCP: Pcp, No    Brief Narrative:  30 y.o. female with medical history significant for IV drug use, specifically heroin, cocaine use, Xanax, infrequent EtOH use, presents emergency department for chief concerns of left lower extremity redness and swelling.  Patient is awake and alert and oriented and does not appear to be in acute distress at this time.  She fully participated in physical exam.  She states that she woke up and noticed that her leg was swollen and red.  She states that prior to going to bed she did not notice anything abnormal with her leg.  She states that it may have been a bug bite however she did not witness a bug biting her.   On physical exam she had bilateral upper extremity of the distal arm on the posterior surface with enlarging wounds.  Images have been provided in media.  She reports that those wounds have been present for several months.  She states that she has been using over-the-counter Neosporin cream and that she will dress it at home.  However the wound has drastically spread over the last several months.     She had bilateral upper extremity track marks.  She was forthright and endorsed IV heroin use.  She states the last time she used heroin was prior to emergency room presentation.  She also endorses smoking crack cocaine and that she takes unprescribed Xanax. She states she always uses clean needles every time she shoots heroin.  Status post surgical I&D on 8/27 with Dr. Aleen Campi for thigh abscess.  Uncomplicated postoperative course.  Blood cultures now 2/4 with Staph aureus.  Antibiotics were de-escalated remotely after blood cultures with MSSA.  No communication between ID doctor and primary team or surgical consultation team.  Discussed with general surgery.  Consider addition of MRSA coverage empirically until we know that wounds are not growing MRSA.  Patient also has E.  coli growing in the urine.  May need some additional antibiotic coverage.  Formal ID consultation is pending   Assessment & Plan:   Principal Problem:   Sepsis (HCC) Active Problems:   Abscess of left leg   Open wounds involving multiple regions of upper extremity   IV drug user   Cocaine use   Cellulitis and abscess of left leg  Left lower extremity posterior leg abscess with associated cellulitis Bilateral upper extremity wounds Sepsis secondary to above Multiple sources of leukocytosis, tachycardia, mild increase in lactic acid Status post vancomycin, cefepime, Flagyl in ED Status post surgical I&D for left lower extremity abscess Plan: Routine postoperative care.  Patient was on broad-spectrum IV antibiotics.  These were de-escalated by infectious disease doctor remotely after blood culture with MSSA.  General surgery concerned about presence of MRSA in the wound.  Patient also has E. coli growing in the urine.  May need expansion of antibiotic coverage.  Infectious disease consultation is pending.  2D echocardiogram is ordered.  Staphylococcal bacteremia In the setting of IV drug use Currently on Ancef  Polysubstance abuse IV drug use Patient was counseled  Tobacco use Nicotine patch Nicotine gum    DVT prophylaxis: SQ Lovenox Code Status: Full Family Communication: Family member at bedside 8/28 Disposition Plan: Status is: Inpatient  Remains inpatient appropriate because:IV treatments appropriate due to intensity of illness or inability to take PO and Inpatient level of care appropriate due to severity of illness  Dispo: The patient is from:  Home              Anticipated d/c is to: Home              Patient currently is not medically stable to d/c.   Difficult to place patient No   Possible discharge in 24 to 48 hours    Level of care: Med-Surg  Consultants:  General surgery  Procedures:  Surgical I&D left leg abscess 8/27  Antimicrobials:   Vancomycin Cefepime Metronidazole   Subjective: Seen and examined.  Sitting in bed listening to music.  Anxious appearing  Objective: Vitals:   01/20/21 0842 01/20/21 1524 01/20/21 1743 01/21/21 0451  BP: (!) 126/104 (!) 144/86 (!) 146/79 (!) 127/92  Pulse: (!) 102 (!) 106 100 97  Resp: 16 20 14 16   Temp: 98.3 F (36.8 C) 98.1 F (36.7 C) 98.3 F (36.8 C) 97.8 F (36.6 C)  TempSrc: Oral Oral  Oral  SpO2: 97% 100% 99% 95%  Weight:      Height:       No intake or output data in the 24 hours ending 01/21/21 1224  Filed Weights   01/19/21 1349  Weight: 59 kg    Examination:  General exam: Appears anxious.  Somewhat tremulous Respiratory system: Lungs clear.  Normal work of breathing.  Room air Cardiovascular system: S1-S2, regular rate and rhythm, no murmurs Gastrointestinal system: Abdomen is nondistended, soft and nontender. No organomegaly or masses felt. Normal bowel sounds heard. Central nervous system: Alert and oriented. No focal neurological deficits. Extremities: Symmetric 5 x 5 power. Skin: Bilateral upper extremities in surgical dressings.  Left lower extremity in surgical dressings.  Dressings CDI Psychiatry: Judgement and insight appear normal. Mood & affect anxious.     Data Reviewed: I have personally reviewed following labs and imaging studies  CBC: Recent Labs  Lab 01/19/21 1355 01/20/21 0510 01/21/21 0429  WBC 24.8* 25.2* 23.0*  NEUTROABS 20.5*  --   --   HGB 8.5* 7.7* 8.8*  HCT 26.5* 23.7* 27.2*  MCV 76.6* 79.0* 75.1*  PLT 443* 402* 358   Basic Metabolic Panel: Recent Labs  Lab 01/19/21 1355 01/20/21 0510  NA 140 138  K 4.8 4.5  CL 107 107  CO2 24 24  GLUCOSE 156* 188*  BUN 18 12  CREATININE 0.79 0.73  CALCIUM 8.4* 7.8*   GFR: Estimated Creatinine Clearance: 81.8 mL/min (by C-G formula based on SCr of 0.73 mg/dL). Liver Function Tests: Recent Labs  Lab 01/19/21 1355  AST 15  ALT 13  ALKPHOS 110  BILITOT 0.2*  PROT  6.3*  ALBUMIN 2.4*   No results for input(s): LIPASE, AMYLASE in the last 168 hours. No results for input(s): AMMONIA in the last 168 hours. Coagulation Profile: Recent Labs  Lab 01/19/21 1438  INR 1.1   Cardiac Enzymes: Recent Labs  Lab 01/19/21 2227  CKTOTAL 95   BNP (last 3 results) No results for input(s): PROBNP in the last 8760 hours. HbA1C: No results for input(s): HGBA1C in the last 72 hours. CBG: No results for input(s): GLUCAP in the last 168 hours. Lipid Profile: No results for input(s): CHOL, HDL, LDLCALC, TRIG, CHOLHDL, LDLDIRECT in the last 72 hours. Thyroid Function Tests: No results for input(s): TSH, T4TOTAL, FREET4, T3FREE, THYROIDAB in the last 72 hours. Anemia Panel: No results for input(s): VITAMINB12, FOLATE, FERRITIN, TIBC, IRON, RETICCTPCT in the last 72 hours. Sepsis Labs: Recent Labs  Lab 01/19/21 1355 01/19/21 1611  LATICACIDVEN 2.1* 1.9  Recent Results (from the past 240 hour(s))  Blood Culture (routine x 2)     Status: None (Preliminary result)   Collection Time: 01/19/21  1:55 PM   Specimen: BLOOD  Result Value Ref Range Status   Specimen Description BLOOD RT FOOT  Final   Special Requests Blood Culture adequate volume  Final   Culture   Final    NO GROWTH 2 DAYS Performed at [facility], [address] [place], [place] [address]    Report Status PENDING  Incomplete  Urine Culture     Status: Abnormal   Collection Time: 01/19/21  2:41 PM   Specimen: Urine, Clean Catch  Result Value Ref Range Status   Specimen Description   Final    URINE, CLEAN CATCH Performed at [facility], [address], [place], [place] [address]    Special Requests   Final    NONE Performed at [facility], [address], [place], [place] [address]    Culture >=100,000 COLONIES/mL ESCHERICHIA COLI (A)  Final   Report Status 01/21/2021 FINAL  Final   Organism ID, Bacteria ESCHERICHIA COLI (A)  Final       Susceptibility   Escherichia coli - MIC*    AMPICILLIN 8 SENSITIVE Sensitive     CEFAZOLIN <=4 SENSITIVE Sensitive     CEFEPIME <=0.12 SENSITIVE Sensitive     CEFTRIAXONE <=0.25 SENSITIVE Sensitive     CIPROFLOXACIN <=0.25 SENSITIVE Sensitive     GENTAMICIN <=1 SENSITIVE Sensitive     IMIPENEM <=0.25 SENSITIVE Sensitive     NITROFURANTOIN <=16 SENSITIVE Sensitive     TRIMETH/SULFA <=20 SENSITIVE Sensitive     AMPICILLIN/SULBACTAM <=2 SENSITIVE Sensitive     PIP/TAZO <=4 SENSITIVE Sensitive     * >=100,000 COLONIES/mL ESCHERICHIA COLI  Resp Panel by RT-PCR (Flu A&B, Covid) Nasopharyngeal Swab     Status: None   Collection Time: 01/19/21  3:07 PM   Specimen: Nasopharyngeal Swab; Nasopharyngeal(NP) swabs in vial transport medium  Result Value Ref Range Status   SARS Coronavirus 2 by RT PCR NEGATIVE NEGATIVE Final    Comment: (NOTE) SARS-CoV-2 target nucleic acids are NOT DETECTED.  The SARS-CoV-2 RNA is generally detectable in upper respiratory specimens during the acute phase of infection. The lowest concentration of SARS-CoV-2 viral copies this assay can detect is 138 copies/mL. A negative result does not preclude SARS-Cov-2 infection and should not be used as the sole basis for treatment or other patient management decisions. A negative result may occur with  improper specimen collection/handling, submission of specimen other than nasopharyngeal swab, presence of viral mutation(s) within the areas targeted by this assay, and inadequate number of viral copies(<138 copies/mL). A negative result must be combined with clinical observations, patient history, and epidemiological information. The expected result is Negative.  Fact Sheet for Patients:  BloggerCourse.com  Fact Sheet for Healthcare Providers:  SeriousBroker.it  This test is no t yet approved or cleared by the [place] FDA and  has been authorized for detection  and/or diagnosis of SARS-CoV-2 by FDA under an Emergency Use Authorization (EUA). This EUA will remain  in effect (meaning this test can be used) for the duration of the COVID-19 declaration under Section 564(b)(1) of the Act, 21 U.S.C.section 360bbb-3(b)(1), unless the authorization is terminated  or revoked sooner.       Influenza A by PCR NEGATIVE NEGATIVE Final   Influenza B by PCR NEGATIVE NEGATIVE Final    Comment: (NOTE) The Xpert Xpress SARS-CoV-2/FLU/RSV plus  assay is intended as an aid in the diagnosis of influenza from Nasopharyngeal swab specimens and should not be used as a sole basis for treatment. Nasal washings and aspirates are unacceptable for Xpert Xpress SARS-CoV-2/FLU/RSV testing.  Fact Sheet for Patients: BloggerCourse.com  Fact Sheet for Healthcare Providers: SeriousBroker.it  This test is not yet approved or cleared by the [place] FDA and has been authorized for detection and/or diagnosis of SARS-CoV-2 by FDA under an Emergency Use Authorization (EUA). This EUA will remain in effect (meaning this test can be used) for the duration of the COVID-19 declaration under Section 564(b)(1) of the Act, 21 U.S.C. section 360bbb-3(b)(1), unless the authorization is terminated or revoked.  Performed at [facility], [address] [place], [place] [address]   Blood Culture (routine x 2)     Status: Abnormal (Preliminary result)   Collection Time: 01/19/21  3:09 PM   Specimen: BLOOD  Result Value Ref Range Status   Specimen Description   Final    BLOOD  BLRA Performed at [facility], [address], [place], [place] [address]    Special Requests   Final    Blood Culture results may not be optimal due to an inadequate volume of blood received in culture bottles Performed at [facility], [address] [place], [place] [address]    Culture  Setup Time   Final    Organism  ID to follow IN BOTH AEROBIC AND ANAEROBIC BOTTLES GRAM POSITIVE COCCI CRITICAL RESULT CALLED TO, READ BACK BY AND VERIFIED WITH: ABBY ELLINGTON AT 0722 01/20/21.PMF Performed at [facility], [address], [place], [place] [address]    Culture (A)  Final    STAPHYLOCOCCUS AUREUS SUSCEPTIBILITIES TO FOLLOW Performed at [facility], [address] [place], [place] [address]    Report Status PENDING  Incomplete  Blood Culture ID Panel (Reflexed)     Status: Abnormal   Collection Time: 01/19/21  3:09 PM  Result Value Ref Range Status   Enterococcus faecalis NOT DETECTED NOT DETECTED Final   Enterococcus Faecium NOT DETECTED NOT DETECTED Final   Listeria monocytogenes NOT DETECTED NOT DETECTED Final   Staphylococcus species DETECTED (A) NOT DETECTED Final    Comment: CRITICAL RESULT CALLED TO, READ BACK BY AND VERIFIED WITH: ABBY ELLINGTON AT 0722 01/20/21.PMF    Staphylococcus aureus (BCID) DETECTED (A) NOT DETECTED Final    Comment: CRITICAL RESULT CALLED TO, READ BACK BY AND VERIFIED WITH: ABBY ELLINGTON AT 0722 01/20/21.PMF    Staphylococcus epidermidis NOT DETECTED NOT DETECTED Final   Staphylococcus lugdunensis NOT DETECTED NOT DETECTED Final   Streptococcus species NOT DETECTED NOT DETECTED Final   Streptococcus agalactiae NOT DETECTED NOT DETECTED Final   Streptococcus pneumoniae NOT DETECTED NOT DETECTED Final   Streptococcus pyogenes NOT DETECTED NOT DETECTED Final   A.calcoaceticus-baumannii NOT DETECTED NOT DETECTED Final   Bacteroides fragilis NOT DETECTED NOT DETECTED Final   Enterobacterales NOT DETECTED NOT DETECTED Final   Enterobacter cloacae complex NOT DETECTED NOT DETECTED Final   Escherichia coli NOT DETECTED NOT DETECTED Final   Klebsiella aerogenes NOT DETECTED NOT DETECTED Final   Klebsiella oxytoca NOT DETECTED NOT DETECTED Final   Klebsiella pneumoniae NOT DETECTED NOT DETECTED Final   Proteus species NOT DETECTED NOT DETECTED Final    Salmonella species NOT DETECTED NOT DETECTED Final   Serratia marcescens NOT DETECTED NOT DETECTED Final   Haemophilus influenzae NOT DETECTED NOT DETECTED Final   Neisseria meningitidis NOT DETECTED  NOT DETECTED Final   Pseudomonas aeruginosa NOT DETECTED NOT DETECTED Final   Stenotrophomonas maltophilia NOT DETECTED NOT DETECTED Final   Candida albicans NOT DETECTED NOT DETECTED Final   Candida auris NOT DETECTED NOT DETECTED Final   Candida glabrata NOT DETECTED NOT DETECTED Final   Candida krusei NOT DETECTED NOT DETECTED Final   Candida parapsilosis NOT DETECTED NOT DETECTED Final   Candida tropicalis NOT DETECTED NOT DETECTED Final   Cryptococcus neoformans/gattii NOT DETECTED NOT DETECTED Final   Meth resistant mecA/C and MREJ NOT DETECTED NOT DETECTED Final    Comment: Performed at [facility], [address] [place], [place] [address]  Aerobic/Anaerobic Culture w Gram Stain (surgical/deep wound)     Status: None (Preliminary result)   Collection Time: 01/19/21  7:01 PM   Specimen: Wound  Result Value Ref Range Status   Specimen Description   Final    ABSCESS LOWER LEFT LEG Performed at [facility], [address] [place], [place] [address]    Special Requests   Final    NONE Performed at [facility], [address] [place], [place] [address]    Gram Stain   Final    RARE SQUAMOUS EPITHELIAL CELLS PRESENT FEW WBC SEEN FEW GRAM POSITIVE COCCI Performed at [facility], [address] [place], [place] [address]    Culture PENDING  Incomplete   Report Status PENDING  Incomplete         Radiology Studies: US Venous Img Lower Unilateral Left  Result Date: 01/19/2021 CLINICAL DATA:  IV drug use. Abscess or DVT. Swelling and redness of the left lower extremity. EXAM: Left LOWER EXTREMITY VENOUS DOPPLER ULTRASOUND TECHNIQUE: Gray-scale sonography with compression, as well as color and duplex ultrasound, were performed to evaluate  the deep venous system(s) from the level of the common femoral vein through the popliteal and proximal calf veins. COMPARISON:  None. FINDINGS: VENOUS Normal compressibility of the common femoral, superficial femoral, and popliteal veins, as well as the visualized calf veins. Visualized portions of profunda femoral vein and great saphenous vein unremarkable. No filling defects to suggest DVT on grayscale or color Doppler imaging. Doppler waveforms show normal direction of venous flow, normal respiratory plasticity and response to augmentation. OTHER There is a 5.4 x 2.7 x 6 cm heterogeneous isoechoic fluid collection within the popliteal fossa that extends inferiorly to the proximal calf. Limitations: none IMPRESSION: 1. No left lower extremity deep venous thrombosis. 2. A 5.4 x 2.7 x 6 cm heterogeneous isoechoic fluid collection within the left popliteal fossa extends inferiorly to the proximal calf. Finding may represent a liquified hematoma versus abscess. Electronically Signed   By: Tish FredericksonMorgane  Naveau M.D.   On: 01/19/2021 16:20   DG Chest Port 1 View  Result Date: 01/19/2021 CLINICAL DATA:  Questionable sepsis. EXAM: PORTABLE CHEST 1 VIEW COMPARISON:  None. FINDINGS: The heart, hila, and mediastinum are normal. No pneumothorax. No nodules or masses. No focal infiltrates. IMPRESSION: No active disease. Electronically Signed   By: Gerome Samavid  Williams III M.D.   On: 01/19/2021 15:18   CT EXTREMITY LOWER LEFT W CONTRAST  Result Date: 01/19/2021 CLINICAL DATA:  Left leg infection.  History of IV drug use. EXAM: CT OF THE LOWER LEFT EXTREMITY WITH CONTRAST TECHNIQUE: Multidetector CT imaging of the lower left extremity was performed according to the standard protocol following intravenous contrast administration. CONTRAST:  80mL OMNIPAQUE IOHEXOL 350 MG/ML SOLN COMPARISON:  Left lower extremity ultrasound from same day. FINDINGS: Bones/Joint/Cartilage  No fracture or dislocation. Joint spaces are preserved. No joint  effusion. Ligaments Ligaments are suboptimally evaluated by CT. Muscles and Tendons Grossly intact. Soft tissue Ill-defined partially rim enhancing fluid collection involving the posteromedial knee, measuring approximately 8.4 x 3.2 x 12.6 cm (series 5, image 303; series 8, image 86). The fluid collection involves the deep compartment between the pes anserine tendons and semimembranosus tendon with extension into the proximal lower leg along the medial gastrocnemius muscle. No subcutaneous emphysema. Scattered soft tissue fat stranding and fluid extending from the mid thigh to the ankle. Circumferential skin thickening of the lower leg. No soft tissue mass.  Reactive left inguinal lymphadenopathy. IMPRESSION: 1. Ill-defined abscess of the posteromedial knee, measuring approximately 8.4 x 3.2 x 12.6 cm. The fluid collection involves the deep compartment between the pes anserine tendons and semimembranosus tendon with extension into the proximal lower leg along the medial gastrocnemius muscle. 2. Scattered soft tissue fat stranding and fluid extending from the mid thigh to the ankle, consistent with cellulitis. No subcutaneous emphysema. 3. No acute osseous abnormality. Electronically Signed   By: Obie Dredge M.D.   On: 01/19/2021 17:28        Scheduled Meds:  enoxaparin (LOVENOX) injection  40 mg Subcutaneous Q24H   ketorolac  30 mg Intravenous Q6H   nicotine  21 mg Transdermal Daily   Continuous Infusions:   ceFAZolin (ANCEF) IV 2 g (01/21/21 0516)     LOS: 2 days    Time spent: 25 minutes    Tresa Moore, MD [facility] Pager [phone]  If 7PM-7AM, please contact night-coverage 01/21/2021, 12:24 PM

## 2021-01-21 NOTE — Progress Notes (Signed)
*  PRELIMINARY RESULTS* Echocardiogram 2D Echocardiogram has been performed.  Cindy Cohen 01/21/2021, 10:22 AM

## 2021-01-22 LAB — HEPATITIS PANEL, ACUTE
HCV Ab: REACTIVE — AB
Hep A IgM: NONREACTIVE
Hep B C IgM: NONREACTIVE
Hepatitis B Surface Ag: NONREACTIVE

## 2021-01-22 LAB — CULTURE, BLOOD (ROUTINE X 2)

## 2021-01-22 LAB — CBC WITH DIFFERENTIAL/PLATELET
Abs Immature Granulocytes: 0.69 10*3/uL — ABNORMAL HIGH (ref 0.00–0.07)
Basophils Absolute: 0.1 10*3/uL (ref 0.0–0.1)
Basophils Relative: 1 %
Eosinophils Absolute: 0.4 10*3/uL (ref 0.0–0.5)
Eosinophils Relative: 3 %
HCT: 24 % — ABNORMAL LOW (ref 36.0–46.0)
Hemoglobin: 7.9 g/dL — ABNORMAL LOW (ref 12.0–15.0)
Immature Granulocytes: 5 %
Lymphocytes Relative: 17 %
Lymphs Abs: 2.1 10*3/uL (ref 0.7–4.0)
MCH: 25.4 pg — ABNORMAL LOW (ref 26.0–34.0)
MCHC: 32.9 g/dL (ref 30.0–36.0)
MCV: 77.2 fL — ABNORMAL LOW (ref 80.0–100.0)
Monocytes Absolute: 0.6 10*3/uL (ref 0.1–1.0)
Monocytes Relative: 5 %
Neutro Abs: 8.9 10*3/uL — ABNORMAL HIGH (ref 1.7–7.7)
Neutrophils Relative %: 69 %
Platelets: 368 10*3/uL (ref 150–400)
RBC: 3.11 MIL/uL — ABNORMAL LOW (ref 3.87–5.11)
RDW: 17.1 % — ABNORMAL HIGH (ref 11.5–15.5)
WBC: 12.7 10*3/uL — ABNORMAL HIGH (ref 4.0–10.5)
nRBC: 0 % (ref 0.0–0.2)

## 2021-01-22 LAB — BASIC METABOLIC PANEL
Anion gap: 9 (ref 5–15)
BUN: 15 mg/dL (ref 6–20)
CO2: 25 mmol/L (ref 22–32)
Calcium: 8.1 mg/dL — ABNORMAL LOW (ref 8.9–10.3)
Chloride: 105 mmol/L (ref 98–111)
Creatinine, Ser: 0.87 mg/dL (ref 0.44–1.00)
GFR, Estimated: >60 mL/min (ref >60)
Glucose, Bld: 78 mg/dL (ref 70–99)
Potassium: 3.8 mmol/L (ref 3.5–5.1)
Sodium: 139 mmol/L (ref 135–145)

## 2021-01-22 LAB — HIV ANTIBODY (ROUTINE TESTING W REFLEX): HIV Screen 4th Generation wRfx: NONREACTIVE

## 2021-01-22 MED ORDER — SODIUM CHLORIDE 0.9 % IV SOLN: INTRAVENOUS | Status: DC

## 2021-01-22 NOTE — Progress Notes (Signed)
    [facility] has been requested to perform a transesophageal echocardiogram on Genelle Bal for MSSA bacteremia.  After careful review of history and examination, the risks and benefits of transesophageal echocardiogram have been explained including risks of esophageal damage, perforation (1:10,000 risk), bleeding, pharyngeal hematoma as well as other potential complications associated with conscious sedation including aspiration, arrhythmia, respiratory failure and death. Alternatives to treatment were discussed, questions were answered. Patient is willing to proceed.    2D surface echo 01/21/2021: 1. Left ventricular ejection fraction, by estimation, is 60 to 65%. The  left ventricle has normal function. The left ventricle has no regional  wall motion abnormalities. Left ventricular diastolic parameters were  normal.   2. Right ventricular systolic function is normal. The right ventricular  size is normal.   3. The mitral valve is normal in structure. Mild mitral valve  regurgitation. No evidence of mitral stenosis.   4. The aortic valve is normal in structure. Aortic valve regurgitation is  not visualized. No aortic stenosis is present.   Conclusion(s)/Recommendation(s): No evidence of valvular vegetations on  this transthoracic echocardiogram.     Eula Listen, PA-C 01/22/2021 9:44 AM

## 2021-01-22 NOTE — Progress Notes (Signed)
Two phlebotomists tried to draw blood from patient's veins and were unsuccessful. Arturo Morton

## 2021-01-22 NOTE — Progress Notes (Signed)
Mobility Specialist - Progress Note   01/22/21 1500  Mobility  Activity Ambulated in room  Level of Assistance Independent  Assistive Device None  Distance Ambulated (ft) 50 ft  Mobility Ambulated independently in room  Mobility Response Tolerated well  Mobility performed by Mobility specialist  $Mobility charge 1 Mobility    Pt ambulated in room independently. Soreness, but denies pain.    Cindy Cohen Mobility Specialist 01/22/21, 3:02 PM

## 2021-01-22 NOTE — Progress Notes (Signed)
PROGRESS NOTE    Cindy Cohen  ZOX:[id number] DOB: 10/20/1990 DOA: 01/19/2021 PCP: Pcp, No    Brief Narrative:  30 y.o. female with medical history significant for IV drug use, specifically heroin, cocaine use, Xanax, infrequent EtOH use, presents emergency department for chief concerns of left lower extremity redness and swelling.  Patient is awake and alert and oriented and does not appear to be in acute distress at this time.  She fully participated in physical exam.  She states that she woke up and noticed that her leg was swollen and red.  She states that prior to going to bed she did not notice anything abnormal with her leg.  She states that it may have been a bug bite however she did not witness a bug biting her.   On physical exam she had bilateral upper extremity of the distal arm on the posterior surface with enlarging wounds.  Images have been provided in media.  She reports that those wounds have been present for several months.  She states that she has been using over-the-counter Neosporin cream and that she will dress it at home.  However the wound has drastically spread over the last several months.     She had bilateral upper extremity track marks.  She was forthright and endorsed IV heroin use.  She states the last time she used heroin was prior to emergency room presentation.  She also endorses smoking crack cocaine and that she takes unprescribed Xanax. She states she always uses clean needles every time she shoots heroin.  Status post surgical I&D on 8/27 with Dr. Aleen CampiPiscoya for thigh abscess.  Uncomplicated postoperative course.  Blood cultures now 2/4 with Staph aureus.  Antibiotics were de-escalated remotely after blood cultures with MSSA.  Formal infectious disease consultation was done here.  Ancef was continued and metronidazole was added.  Patient was seen in consultation by cardiology.  Plan for TEE on 8/31 considering staph bacteremia and auscultated murmur on  exam  Assessment & Plan:   Principal Problem:   Sepsis (HCC) Active Problems:   Abscess of left leg   Open wounds involving multiple regions of upper extremity   IV drug user   Cocaine use   Cellulitis and abscess of left leg  Left lower extremity posterior leg abscess with associated cellulitis Bilateral upper extremity wounds Sepsis secondary to above Multiple sources of leukocytosis, tachycardia, mild increase in lactic acid Status post vancomycin, cefepime, Flagyl in ED Status post surgical I&D for left lower extremity abscess TTE within normal limits Plan: Routine postoperative care Continue Ancef and Flagyl per ID recommendations Cardiology consulted, TEE tomorrow ID follow-up  Staphylococcal bacteremia In the setting of IV drug use Currently on Ancef Flagyl added TEE planned 8/31  Polysubstance abuse IV drug use Patient was counseled  Tobacco use Nicotine patch Nicotine gum    DVT prophylaxis: SQ Lovenox Code Status: Full Family Communication: Family member at bedside 8/28 Disposition Plan: Status is: Inpatient  Remains inpatient appropriate because:IV treatments appropriate due to intensity of illness or inability to take PO and Inpatient level of care appropriate due to severity of illness  Dispo: The patient is from: Home              Anticipated d/c is to: Home              Patient currently is not medically stable to d/c.   Difficult to place patient No   Discharge pending TEE and ID follow-up  Level of care: Med-Surg  Consultants:  General surgery  Procedures:  Surgical I&D left leg abscess 8/27  Antimicrobials:  Metronidazole    Subjective: Seen and examined.  No acute distress.  Objective: Vitals:   01/21/21 1802 01/21/21 2231 01/22/21 0429 01/22/21 0819  BP: 137/72 (!) 143/65 (!) 143/95 (!) 156/101  Pulse: (!) 101 81 75 98  Resp: Temp: 98.9 F (37.2 C) 98.1 F (36.7 C) 98.6 F (37 C) 99.4 F (37.4 C)   TempSrc: Oral Oral  Oral  SpO2: 96% 100%  96%  Weight:      Height:        Intake/Output Summary (Last 24 hours) at 01/22/2021 1201 Last data filed at 01/22/2021 0900 Gross per 24 hour  Intake 960 ml  Output --  Net 960 ml    Filed Weights   01/19/21 1349  Weight: 59 kg    Examination:  General exam: Anxious and tremulous Respiratory system: Lungs clear.  Normal work of breathing.  Room air Cardiovascular system: S1-S2, regular rate and rhythm, no murmurs Gastrointestinal system: Soft, nontender, nondistended, normal bowel sounds Central nervous system: Alert and oriented. No focal neurological deficits. Extremities: Symmetric 5 x 5 power. Skin: Bilateral upper extremities in surgical dressings.  Left lower extremity in surgical dressings.  Dressings CDI Psychiatry: Judgement and insight appear normal. Mood & affect anxious.     Data Reviewed: I have personally reviewed following labs and imaging studies  CBC: Recent Labs  Lab 01/19/21 1355 01/20/21 0510 01/21/21 0429 01/22/21 0805  WBC 24.8* 25.2* 23.0* 12.7*  NEUTROABS 20.5*  --   --  8.9*  HGB 8.5* 7.7* 8.8* 7.9*  HCT 26.5* 23.7* 27.2* 24.0*  MCV 76.6* 79.0* 75.1* 77.2*  PLT 443* 402* 358 368   Basic Metabolic Panel: Recent Labs  Lab 01/19/21 1355 01/20/21 0510 01/22/21 0805  NA 140 138 139  K 4.8 4.5 3.8  CL 107 107 105  CO2 GLUCOSE 156* 188* 78  BUN CREATININE 0.79 0.73 0.87  CALCIUM 8.4* 7.8* 8.1*   GFR: Estimated Creatinine Clearance: 75.2 mL/min (by C-G formula based on SCr of 0.87 mg/dL). Liver Function Tests: Recent Labs  Lab 01/19/21 1355  AST 15  ALT 13  ALKPHOS 110  BILITOT 0.2*  PROT 6.3*  ALBUMIN 2.4*   No results for input(s): LIPASE, AMYLASE in the last 168 hours. No results for input(s): AMMONIA in the last 168 hours. Coagulation Profile: Recent Labs  Lab 01/19/21 1438  INR 1.1   Cardiac Enzymes: Recent Labs  Lab 01/19/21 2227  CKTOTAL 95    BNP (last 3 results) No results for input(s): PROBNP in the last 8760 hours. HbA1C: No results for input(s): HGBA1C in the last 72 hours. CBG: No results for input(s): GLUCAP in the last 168 hours. Lipid Profile: No results for input(s): CHOL, HDL, LDLCALC, TRIG, CHOLHDL, LDLDIRECT in the last 72 hours. Thyroid Function Tests: No results for input(s): TSH, T4TOTAL, FREET4, T3FREE, THYROIDAB in the last 72 hours. Anemia Panel: No results for input(s): VITAMINB12, FOLATE, FERRITIN, TIBC, IRON, RETICCTPCT in the last 72 hours. Sepsis Labs: Recent Labs  Lab 01/19/21 1355 01/19/21 1611  LATICACIDVEN 2.1* 1.9    Recent Results (from the past 240 hour(s))  Blood Culture (routine x 2)     Status: None (Preliminary result)   Collection Time: 01/19/21  1:55 PM   Specimen: BLOOD  Result Value Ref Range Status  Specimen Description BLOOD RT FOOT  Final   Special Requests Blood Culture adequate volume  Final   Culture   Final    NO GROWTH 3 DAYS Performed at [facility], [address] [place], [place] [address]    Report Status PENDING  Incomplete  Urine Culture     Status: Abnormal   Collection Time: 01/19/21  2:41 PM   Specimen: Urine, Clean Catch  Result Value Ref Range Status   Specimen Description   Final    URINE, CLEAN CATCH Performed at [facility], [address], [place], [place] [address]    Special Requests   Final    NONE Performed at [facility], [address] [place], [place] [address]    Culture >=100,000 COLONIES/mL ESCHERICHIA COLI (A)  Final   Report Status 01/21/2021 FINAL  Final   Organism ID, Bacteria ESCHERICHIA COLI (A)  Final      Susceptibility   Escherichia coli - MIC*    AMPICILLIN 8 SENSITIVE Sensitive     CEFAZOLIN <=4 SENSITIVE Sensitive     CEFEPIME <=0.12 SENSITIVE Sensitive     CEFTRIAXONE <=0.25 SENSITIVE Sensitive     CIPROFLOXACIN <=0.25 SENSITIVE Sensitive     GENTAMICIN <=1 SENSITIVE Sensitive      IMIPENEM <=0.25 SENSITIVE Sensitive     NITROFURANTOIN <=16 SENSITIVE Sensitive     TRIMETH/SULFA <=20 SENSITIVE Sensitive     AMPICILLIN/SULBACTAM <=2 SENSITIVE Sensitive     PIP/TAZO <=4 SENSITIVE Sensitive     * >=100,000 COLONIES/mL ESCHERICHIA COLI  Resp Panel by RT-PCR (Flu A&B, Covid) Nasopharyngeal Swab     Status: None   Collection Time: 01/19/21  3:07 PM   Specimen: Nasopharyngeal Swab; Nasopharyngeal(NP) swabs in vial transport medium  Result Value Ref Range Status   SARS Coronavirus 2 by RT PCR NEGATIVE NEGATIVE Final    Comment: (NOTE) SARS-CoV-2 target nucleic acids are NOT DETECTED.  The SARS-CoV-2 RNA is generally detectable in upper respiratory specimens during the acute phase of infection. The lowest concentration of SARS-CoV-2 viral copies this assay can detect is 138 copies/mL. A negative result does not preclude SARS-Cov-2 infection and should not be used as the sole basis for treatment or other patient management decisions. A negative result may occur with  improper specimen collection/handling, submission of specimen other than nasopharyngeal swab, presence of viral mutation(s) within the areas targeted by this assay, and inadequate number of viral copies(<138 copies/mL). A negative result must be combined with clinical observations, patient history, and epidemiological information. The expected result is Negative.  Fact Sheet for Patients:  BloggerCourse.com  Fact Sheet for Healthcare Providers:  SeriousBroker.it  This test is no t yet approved or cleared by the [place] FDA and  has been authorized for detection and/or diagnosis of SARS-CoV-2 by FDA under an Emergency Use Authorization (EUA). This EUA will remain  in effect (meaning this test can be used) for the duration of the COVID-19 declaration under Section 564(b)(1) of the Act, 21 U.S.C.section 360bbb-3(b)(1), unless the authorization is  terminated  or revoked sooner.       Influenza A by PCR NEGATIVE NEGATIVE Final   Influenza B by PCR NEGATIVE NEGATIVE Final    Comment: (NOTE) The Xpert Xpress SARS-CoV-2/FLU/RSV plus assay is intended as an aid in the diagnosis of influenza from Nasopharyngeal swab specimens and should not be used as a sole basis for treatment. Nasal washings and aspirates are unacceptable for Xpert Xpress SARS-CoV-2/FLU/RSV testing.  Fact Sheet for  Patients: BloggerCourse.com  Fact Sheet for Healthcare Providers: SeriousBroker.it  This test is not yet approved or cleared by the [place] FDA and has been authorized for detection and/or diagnosis of SARS-CoV-2 by FDA under an Emergency Use Authorization (EUA). This EUA will remain in effect (meaning this test can be used) for the duration of the COVID-19 declaration under Section 564(b)(1) of the Act, 21 U.S.C. section 360bbb-3(b)(1), unless the authorization is terminated or revoked.  Performed at [facility], [address] [place], [place] [address]   Blood Culture (routine x 2)     Status: Abnormal   Collection Time: 01/19/21  3:09 PM   Specimen: BLOOD  Result Value Ref Range Status   Specimen Description   Final    BLOOD  BLRA Performed at [facility], [address], [place], [place] [address]    Special Requests   Final    Blood Culture results may not be optimal due to an inadequate volume of blood received in culture bottles Performed at [facility], [address] [place], [place] [address]    Culture  Setup Time   Final    Organism ID to follow IN BOTH AEROBIC AND ANAEROBIC BOTTLES GRAM POSITIVE COCCI CRITICAL RESULT CALLED TO, READ BACK BY AND VERIFIED WITH: ABBY ELLINGTON AT 0722 01/20/21.PMF Performed at [facility], [address] [place], [place] [address]    Culture STAPHYLOCOCCUS AUREUS (A)  Final   Report Status  01/22/2021 FINAL  Final   Organism ID, Bacteria STAPHYLOCOCCUS AUREUS  Final      Susceptibility   Staphylococcus aureus - MIC*    CIPROFLOXACIN <=0.5 SENSITIVE Sensitive     ERYTHROMYCIN >=8 RESISTANT Resistant     GENTAMICIN <=0.5 SENSITIVE Sensitive     OXACILLIN 0.5 SENSITIVE Sensitive     TETRACYCLINE <=1 SENSITIVE Sensitive     VANCOMYCIN 1 SENSITIVE Sensitive     TRIMETH/SULFA <=10 SENSITIVE Sensitive     CLINDAMYCIN <=0.25 SENSITIVE Sensitive     RIFAMPIN <=0.5 SENSITIVE Sensitive     Inducible Clindamycin NEGATIVE Sensitive     * STAPHYLOCOCCUS AUREUS  Blood Culture ID Panel (Reflexed)     Status: Abnormal   Collection Time: 01/19/21  3:09 PM  Result Value Ref Range Status   Enterococcus faecalis NOT DETECTED NOT DETECTED Final   Enterococcus Faecium NOT DETECTED NOT DETECTED Final   Listeria monocytogenes NOT DETECTED NOT DETECTED Final   Staphylococcus species DETECTED (A) NOT DETECTED Final    Comment: CRITICAL RESULT CALLED TO, READ BACK BY AND VERIFIED WITH: ABBY ELLINGTON AT 0722 01/20/21.PMF    Staphylococcus aureus (BCID) DETECTED (A) NOT DETECTED Final    Comment: CRITICAL RESULT CALLED TO, READ BACK BY AND VERIFIED WITH: ABBY ELLINGTON AT 0722 01/20/21.PMF    Staphylococcus epidermidis NOT DETECTED NOT DETECTED Final   Staphylococcus lugdunensis NOT DETECTED NOT DETECTED Final   Streptococcus species NOT DETECTED NOT DETECTED Final   Streptococcus agalactiae NOT DETECTED NOT DETECTED Final   Streptococcus pneumoniae NOT DETECTED NOT DETECTED Final   Streptococcus pyogenes NOT DETECTED NOT DETECTED Final   A.calcoaceticus-baumannii NOT DETECTED NOT DETECTED Final   Bacteroides fragilis NOT DETECTED NOT DETECTED Final   Enterobacterales NOT DETECTED NOT DETECTED Final   Enterobacter cloacae complex NOT DETECTED NOT DETECTED Final   Escherichia coli NOT DETECTED NOT DETECTED Final   Klebsiella aerogenes NOT DETECTED NOT DETECTED Final   Klebsiella oxytoca NOT  DETECTED NOT DETECTED Final   Klebsiella pneumoniae NOT DETECTED  NOT DETECTED Final   Proteus species NOT DETECTED NOT DETECTED Final   Salmonella species NOT DETECTED NOT DETECTED Final   Serratia marcescens NOT DETECTED NOT DETECTED Final   Haemophilus influenzae NOT DETECTED NOT DETECTED Final   Neisseria meningitidis NOT DETECTED NOT DETECTED Final   Pseudomonas aeruginosa NOT DETECTED NOT DETECTED Final   Stenotrophomonas maltophilia NOT DETECTED NOT DETECTED Final   Candida albicans NOT DETECTED NOT DETECTED Final   Candida auris NOT DETECTED NOT DETECTED Final   Candida glabrata NOT DETECTED NOT DETECTED Final   Candida krusei NOT DETECTED NOT DETECTED Final   Candida parapsilosis NOT DETECTED NOT DETECTED Final   Candida tropicalis NOT DETECTED NOT DETECTED Final   Cryptococcus neoformans/gattii NOT DETECTED NOT DETECTED Final   Meth resistant mecA/C and MREJ NOT DETECTED NOT DETECTED Final    Comment: Performed at [facility], [address] [place], [place] [address]  Aerobic/Anaerobic Culture w Gram Stain (surgical/deep wound)     Status: None (Preliminary result)   Collection Time: 01/19/21  7:01 PM   Specimen: Wound  Result Value Ref Range Status   Specimen Description ABSCESS LOWER LEFT LEG  Final   Special Requests NONE  Final   Gram Stain   Final    RARE SQUAMOUS EPITHELIAL CELLS PRESENT FEW WBC SEEN FEW GRAM POSITIVE COCCI    Culture MODERATE STAPHYLOCOCCUS AUREUS  Final   Report Status PENDING  Incomplete   Organism ID, Bacteria STAPHYLOCOCCUS AUREUS  Final      Susceptibility   Staphylococcus aureus - MIC*    CIPROFLOXACIN <=0.5 SENSITIVE Sensitive     ERYTHROMYCIN >=8 RESISTANT Resistant     GENTAMICIN <=0.5 SENSITIVE Sensitive     OXACILLIN 0.5 SENSITIVE Sensitive     TETRACYCLINE <=1 SENSITIVE Sensitive     VANCOMYCIN 1 SENSITIVE Sensitive     TRIMETH/SULFA <=10 SENSITIVE Sensitive     CLINDAMYCIN <=0.25 SENSITIVE Sensitive     RIFAMPIN  <=0.5 SENSITIVE Sensitive     Inducible Clindamycin Value in next row Sensitive      NEGATIVEPerformed at [facility], [address], [place], [place] [address]    * MODERATE STAPHYLOCOCCUS AUREUS         Radiology Studies: ECHOCARDIOGRAM COMPLETE  Result Date: 01/21/2021    ECHOCARDIOGRAM REPORT   Patient Name:   Cindy Cohen Date of Exam: 01/21/2021 Medical Rec #:  [id number]        Height:       59.0 in Accession #:    [id number]       Weight:       130.0 lb Date of Birth:  12-07-1990         BSA:          1.536 m Patient Age:    30 years         BP:           127/92 mmHg Patient Gender: F                HR:           97 bpm. Exam Location:  [facility] Procedure: 2D Echo, Cardiac Doppler and Color Doppler Indications:     Bacteremia R78.81  History:         Patient has no prior history of Echocardiogram examinations. IV                  drug user.  Sonographer:     Cristela Blue Referring  Phys:  [id number] Tresa Moore Diagnosing Phys: Lorine Bears MD  Sonographer Comments: Suboptimal apical window and no subcostal window. IMPRESSIONS  1. Left ventricular ejection fraction, by estimation, is 60 to 65%. The left ventricle has normal function. The left ventricle has no regional wall motion abnormalities. Left ventricular diastolic parameters were normal.  2. Right ventricular systolic function is normal. The right ventricular size is normal.  3. The mitral valve is normal in structure. Mild mitral valve regurgitation. No evidence of mitral stenosis.  4. The aortic valve is normal in structure. Aortic valve regurgitation is not visualized. No aortic stenosis is present. Conclusion(s)/Recommendation(s): No evidence of valvular vegetations on this transthoracic echocardiogram. FINDINGS  Left Ventricle: Left ventricular ejection fraction, by estimation, is 60 to 65%. The left ventricle has normal function. The left ventricle has no regional wall motion abnormalities. The left ventricular internal  cavity size was normal in size. There is  no left ventricular hypertrophy. Left ventricular diastolic parameters were normal. Right Ventricle: The right ventricular size is normal. No increase in right ventricular wall thickness. Right ventricular systolic function is normal. Left Atrium: Left atrial size was normal in size. Right Atrium: Right atrial size was normal in size. Pericardium: Trivial pericardial effusion is present. Mitral Valve: The mitral valve is normal in structure. Mild mitral valve regurgitation. No evidence of mitral valve stenosis. Tricuspid Valve: The tricuspid valve is normal in structure. Tricuspid valve regurgitation is trivial. No evidence of tricuspid stenosis. Aortic Valve: The aortic valve is normal in structure. Aortic valve regurgitation is not visualized. No aortic stenosis is present. Aortic valve mean gradient measures 6.5 mmHg. Aortic valve peak gradient measures 12.5 mmHg. Aortic valve area, by VTI measures 2.64 cm. Pulmonic Valve: The pulmonic valve was normal in structure. Pulmonic valve regurgitation is not visualized. No evidence of pulmonic stenosis. Aorta: The aortic root is normal in size and structure. Venous: The inferior vena cava was not well visualized. IAS/Shunts: No atrial level shunt detected by color flow Doppler.  LEFT VENTRICLE PLAX 2D LVIDd:         4.84 cm  Diastology LVIDs:         2.82 cm  LV e' medial:    14.40 cm/s LV PW:         0.91 cm  LV E/e' medial:  7.5 LV IVS:        0.79 cm  LV e' lateral:   15.30 cm/s LVOT diam:     2.10 cm  LV E/e' lateral: 7.1 LV SV:         81 LV SV Index:   53 LVOT Area:     3.46 cm  RIGHT VENTRICLE RV Basal diam:  2.87 cm RV S prime:     15.90 cm/s TAPSE (M-mode): 4.8 cm LEFT ATRIUM             Index       RIGHT ATRIUM           Index LA diam:        3.60 cm 2.34 cm/m  RA Area:     19.20 cm LA Vol (A2C):   75.1 ml 48.90 ml/m RA Volume:   58.60 ml  38.16 ml/m LA Vol (A4C):   74.4 ml 48.44 ml/m LA Biplane Vol: 75.1 ml  48.90 ml/m  AORTIC VALVE                    PULMONIC VALVE AV Area (Vmax):  2.12 cm     PV Vmax:        0.77 m/s AV Area (Vmean):   2.24 cm     PV Peak grad:   2.4 mmHg AV Area (VTI):     2.64 cm     RVOT Peak grad: 5 mmHg AV Vmax:           176.50 cm/s AV Vmean:          122.000 cm/s AV VTI:            0.308 m AV Peak Grad:      12.5 mmHg AV Mean Grad:      6.5 mmHg LVOT Vmax:         108.00 cm/s LVOT Vmean:        78.800 cm/s LVOT VTI:          0.235 m LVOT/AV VTI ratio: 0.76  AORTA Ao Root diam: 2.83 cm MITRAL VALVE                TRICUSPID VALVE MV Area (PHT): 7.44 cm     TR Peak grad:   22.1 mmHg MV Decel Time: 102 msec     TR Vmax:        235.00 cm/s MV E velocity: 108.00 cm/s MV A velocity: 138.00 cm/s  SHUNTS MV E/A ratio:  0.78         Systemic VTI:  0.24 m                             Systemic Diam: 2.10 cm Lorine Bears MD Electronically signed by Lorine Bears MD Signature Date/Time: 01/21/2021/1:32:27 PM    Final         Scheduled Meds:  enoxaparin (LOVENOX) injection  40 mg Subcutaneous Q24H   ketorolac  30 mg Intravenous Q6H   nicotine  21 mg Transdermal Daily   Continuous Infusions:   ceFAZolin (ANCEF) IV 2 g (01/22/21 0524)   metronidazole 500 mg (01/22/21 0817)     LOS: 3 days    Time spent: 25 minutes    Tresa Moore, MD [facility] Pager [phone]  If 7PM-7AM, please contact night-coverage 01/22/2021, 12:01 PM

## 2021-01-22 NOTE — Progress Notes (Addendum)
[facility] SURGICAL PROGRESS NOTE  Hospital Day(s): 3.   Post op day(s): 3 Days Post-Op.   Interval History:  Patient seen and examined No acute events or new complaints overnight.  Patient reports she is doing well Her LLE erythema and swelling continue to improve No fever, chills Leukocytosis is markedly improved this morning; down to 12.7K Cx growing moderate staph aureus; sensitivities pending; ID on board, currently on Cefazolin and Flagyl Also with pan-sensitive E coli UTI Tolerating diet  Vital signs in last 24 hours: [min-max] current  Temp:  [98.1 F (36.7 C)-99.4 F (37.4 C)] 99.4 F (37.4 C) (08/30 0819) Pulse Rate:  [75-101] 98 (08/30 0819) Resp:  [16-20] 16 (08/30 0429) BP: (137-156)/(65-101) 156/101 (08/30 0819) SpO2:  [96 %-100 %] 96 % (08/30 0819)     Height: 4' 11.02" (149.9 cm) Weight: 59 kg BMI (Calculated): 26.24   Intake/Output last 2 shifts:  08/29 0701 - 08/30 0700 In: 600 [IV Piggyback:600] Out: -    Physical Exam:  Constitutional: alert, cooperative and no distress  Respiratory: breathing non-labored at rest  Cardiovascular: regular rate and sinus rhythm  Integumentary: I&D to the left lower extremity to the medial and posterior aspect of the knee with 4 penrose drains in place, induration, swelling and erythema are all markedly improved. Bilateral chronic non-healing wounds to the bilateral forearms, tissues appear healthy, no erythema or drainage   Labs:  CBC Latest Ref Rng & Units 01/22/2021 01/21/2021 01/20/2021  WBC 4.0 - 10.5 K/uL 12.7(H) 23.0(H) 25.2(H)  Hemoglobin 12.0 - 15.0 g/dL 7.9(L) 8.8(L) 7.7(L)  Hematocrit 36.0 - 46.0 % 24.0(L) 27.2(L) 23.7(L)  Platelets 150 - 400 K/uL 368 358 402(H)   CMP Latest Ref Rng & Units 01/20/2021 01/19/2021  Glucose 70 - 99 mg/dL 099(I) 338(S)  BUN 6 - 20 mg/dL 12 18  Creatinine 5.05 - 1.00 mg/dL 3.97 6.73  Sodium 419 - 145 mmol/L 138 140  Potassium 3.5 - 5.1 mmol/L 4.5 4.8  Chloride 98  - 111 mmol/L 107 107  CO2 22 - 32 mmol/L 24 24  Calcium 8.9 - 10.3 mg/dL 7.8(L) 8.4(L)  Total Protein 6.5 - 8.1 g/dL - 6.3(L)  Total Bilirubin 0.3 - 1.2 mg/dL - 3.7(T)  Alkaline Phos 38 - 126 U/L - 110  AST 15 - 41 U/L - 15  ALT 0 - 44 U/L - 13     Imaging studies: No new pertinent imaging studies   Assessment/Plan:  30 y.o. female 3 Days Post-Op s/p incision and drainage of left lower extremity abscess and debridement of bilateral lower extremity wounds   - Continue IV Abx (Cefazolin + Flagyl); follow up Cx; ID on board and appreciate assistance - Continue local wound care:  - LLE I&D: Cover with with dry gauze, secure with Kerlix, to be completed daily - Left Forearm Wound: Pack wound bed with saline moistened gauze, cover with dry gauze, secure with tape or Kerlix, to be completed BID -  Right Forearm Wound: Pack wound bed with saline moistened gauze, cover with dry gauze, secure with tape or Kerlix, to be completed BID    - Pain control prn   - Mobilization as tolerated - Keep LLE elevated   - Further management per primary service; we will follow  - Discharge Planning; She will benefit from additional IV Abx; follow up Cx to narrow Abx for home, hopefully home in next 24 hours  All of the above findings and recommendations were discussed with the patient, and the  medical team, and all of patient's questions were answered to her expressed satisfaction.  -- Lynden Oxford, PA-C [facility] 01/22/2021, 8:25 AM [phone] M-F: 7am - 4pm  I saw and evaluated the patient.  I agree with the above documentation, exam, and plan, which I have edited where appropriate. Duanne Guess  1:28 PM

## 2021-01-22 NOTE — Progress Notes (Addendum)
Date of Admission:  01/19/2021   T  ID: Cindy Cohen is a 30 y.o. female with   Principal Problem:   Sepsis (HCC) Active Problems:   Abscess of left leg   Open wounds involving multiple regions of upper extremity   IV drug user   Cocaine use   Cellulitis and abscess of left leg    Subjective: Says she is feeling better sleeping  Medications:   enoxaparin (LOVENOX) injection  40 mg Subcutaneous Q24H   ketorolac  30 mg Intravenous Q6H   nicotine  21 mg Transdermal Daily    Objective: Vital signs in last 24 hours: Temp:  [98.1 F (36.7 C)-99.4 F (37.4 C)] 99.4 F (37.4 C) (08/30 0819) Pulse Rate:  [75-101] 98 (08/30 0819) Resp:  [16-20] 17 (08/30 0819) BP: (137-156)/(65-101) 156/101 (08/30 0819) SpO2:  [96 %-100 %] 96 % (08/30 0819)  PHYSICAL EXAM:  General: sleeping, on waking her she feels better Says pain leg better.  Lungs: Clear to auscultation bilaterally. No Wheezing or Rhonchi. No rales. Heart: Regular rate and rhythm, 2/6 systolic murmur Abdomen: Soft, non-tender,not distended. Bowel sounds normal. No masses Extremities: b/l forearm wounds Left leg surgical I/D covered by dressing Skin: as above Lymph: Cervical, supraclavicular normal. Neurologic: Grossly non-focal  Lab Results Recent Labs    01/20/21 0510 01/21/21 0429 01/22/21 0805  WBC 25.2* 23.0* 12.7*  HGB 7.7* 8.8* 7.9*  HCT 23.7* 27.2* 24.0*  NA 138  --  139  K 4.5  --  3.8  CL 107  --  105  CO2 24  --  25  BUN 12  --  15  CREATININE 0.73  --  0.87   Liver Panel No results for input(s): PROT, ALBUMIN, AST, ALT, ALKPHOS, BILITOT, BILIDIR, IBILI in the last 72 hours. Sedimentation Rate Recent Labs    01/19/21 2227  ESRSEDRATE 49*   C-Reactive Protein Recent Labs    01/19/21 2227  CRP 23.0*    Microbiology:  Studies/Results: ECHOCARDIOGRAM COMPLETE  Result Date: 01/21/2021    ECHOCARDIOGRAM REPORT   Patient Name:   Cindy Cohen Date of Exam: 01/21/2021 Medical Rec  #:  [id number]        Height:       59.0 in Accession #:    [id number]       Weight:       130.0 lb Date of Birth:  [birth year]         BSA:          1.536 m Patient Age:    30 years         BP:           127/92 mmHg Patient Gender: F                HR:           97 bpm. Exam Location:  [facility] Procedure: 2D Echo, Cardiac Doppler and Color Doppler Indications:     Bacteremia R78.81  History:         Patient has no prior history of Echocardiogram examinations. IV                  drug user.  Sonographer:     Cristela Blue Referring Phys:  [id number] Tresa Moore Diagnosing Phys: Lorine Bears MD  Sonographer Comments: Suboptimal apical window and no subcostal window. IMPRESSIONS  1. Left ventricular ejection fraction, by estimation, is 60 to 65%. The left ventricle has normal function. The left  ventricle has no regional wall motion abnormalities. Left ventricular diastolic parameters were normal.  2. Right ventricular systolic function is normal. The right ventricular size is normal.  3. The mitral valve is normal in structure. Mild mitral valve regurgitation. No evidence of mitral stenosis.  4. The aortic valve is normal in structure. Aortic valve regurgitation is not visualized. No aortic stenosis is present. Conclusion(s)/Recommendation(s): No evidence of valvular vegetations on this transthoracic echocardiogram. FINDINGS  Left Ventricle: Left ventricular ejection fraction, by estimation, is 60 to 65%. The left ventricle has normal function. The left ventricle has no regional wall motion abnormalities. The left ventricular internal cavity size was normal in size. There is  no left ventricular hypertrophy. Left ventricular diastolic parameters were normal. Right Ventricle: The right ventricular size is normal. No increase in right ventricular wall thickness. Right ventricular systolic function is normal. Left Atrium: Left atrial size was normal in size. Right Atrium: Right atrial size was normal in size. Pericardium:  Trivial pericardial effusion is present. Mitral Valve: The mitral valve is normal in structure. Mild mitral valve regurgitation. No evidence of mitral valve stenosis. Tricuspid Valve: The tricuspid valve is normal in structure. Tricuspid valve regurgitation is trivial. No evidence of tricuspid stenosis. Aortic Valve: The aortic valve is normal in structure. Aortic valve regurgitation is not visualized. No aortic stenosis is present. Aortic valve mean gradient measures 6.5 mmHg. Aortic valve peak gradient measures 12.5 mmHg. Aortic valve area, by VTI measures 2.64 cm. Pulmonic Valve: The pulmonic valve was normal in structure. Pulmonic valve regurgitation is not visualized. No evidence of pulmonic stenosis. Aorta: The aortic root is normal in size and structure. Venous: The inferior vena cava was not well visualized. IAS/Shunts: No atrial level shunt detected by color flow Doppler.  LEFT VENTRICLE PLAX 2D LVIDd:         4.84 cm  Diastology LVIDs:         2.82 cm  LV e' medial:    14.40 cm/s LV PW:         0.91 cm  LV E/e' medial:  7.5 LV IVS:        0.79 cm  LV e' lateral:   15.30 cm/s LVOT diam:     2.10 cm  LV E/e' lateral: 7.1 LV SV:         81 LV SV Index:   53 LVOT Area:     3.46 cm  RIGHT VENTRICLE RV Basal diam:  2.87 cm RV S prime:     15.90 cm/s TAPSE (M-mode): 4.8 cm LEFT ATRIUM             Index       RIGHT ATRIUM           Index LA diam:        3.60 cm 2.34 cm/m  RA Area:     19.20 cm LA Vol (A2C):   75.1 ml 48.90 ml/m RA Volume:   58.60 ml  38.16 ml/m LA Vol (A4C):   74.4 ml 48.44 ml/m LA Biplane Vol: 75.1 ml 48.90 ml/m  AORTIC VALVE                    PULMONIC VALVE AV Area (Vmax):    2.12 cm     PV Vmax:        0.77 m/s AV Area (Vmean):   2.24 cm     PV Peak grad:   2.4 mmHg AV Area (VTI):     2.64  cm     RVOT Peak grad: 5 mmHg AV Vmax:           176.50 cm/s AV Vmean:          122.000 cm/s AV VTI:            0.308 m AV Peak Grad:      12.5 mmHg AV Mean Grad:      6.5 mmHg LVOT Vmax:          108.00 cm/s LVOT Vmean:        78.800 cm/s LVOT VTI:          0.235 m LVOT/AV VTI ratio: 0.76  AORTA Ao Root diam: 2.83 cm MITRAL VALVE                TRICUSPID VALVE MV Area (PHT): 7.44 cm     TR Peak grad:   22.1 mmHg MV Decel Time: 102 msec     TR Vmax:        235.00 cm/s MV E velocity: 108.00 cm/s MV A velocity: 138.00 cm/s  SHUNTS MV E/A ratio:  0.78         Systemic VTI:  0.24 m                             Systemic Diam: 2.10 cm Lorine Bears MD Electronically signed by Lorine Bears MD Signature Date/Time: 01/21/2021/1:32:27 PM    Final      Assessment/Plan: MSSA bacteremia with MSSA abscess left leg with surrounding cellulitis S/p I/D On cefazolin  TEE tomorrow B/l forearm necrotizing infection chronic- s/p debridement- added flagyl Leucocytosis- much better HCV antibody reactive- will get RNA HIV NR   Discussed the management with the patient

## 2021-01-23 ENCOUNTER — Inpatient Hospital Stay: Payer: Self-pay | Admitting: Anesthesiology

## 2021-01-23 ENCOUNTER — Encounter
Admission: EM | Discharge status: Against Medical Advice | Payer: Self-pay | Source: Home / Self Care | Attending: Internal Medicine

## 2021-01-23 ENCOUNTER — Other Ambulatory Visit: Payer: Self-pay

## 2021-01-23 ENCOUNTER — Inpatient Hospital Stay (HOSPITAL_COMMUNITY)
Admit: 2021-01-23 | Discharge: 2021-01-23 | Disposition: A | Discharge status: Home / Self Care | Payer: Self-pay | Attending: Physician Assistant | Admitting: Physician Assistant

## 2021-01-23 ENCOUNTER — Encounter: Payer: Self-pay | Admitting: Cardiology

## 2021-01-23 DIAGNOSIS — R7881 Bacteremia: Secondary | ICD-10-CM

## 2021-01-23 HISTORY — PX: TEE WITHOUT CARDIOVERSION: SHX5443

## 2021-01-23 LAB — BASIC METABOLIC PANEL
Anion gap: 10 (ref 5–15)
BUN: 12 mg/dL (ref 6–20)
CO2: 24 mmol/L (ref 22–32)
Calcium: 8.4 mg/dL — ABNORMAL LOW (ref 8.9–10.3)
Chloride: 105 mmol/L (ref 98–111)
Creatinine, Ser: 0.77 mg/dL (ref 0.44–1.00)
GFR, Estimated: >60 mL/min (ref >60)
Glucose, Bld: 89 mg/dL (ref 70–99)
Potassium: 4.1 mmol/L (ref 3.5–5.1)
Sodium: 139 mmol/L (ref 135–145)

## 2021-01-23 LAB — CBC WITH DIFFERENTIAL/PLATELET
Abs Immature Granulocytes: 0.78 10*3/uL — ABNORMAL HIGH (ref 0.00–0.07)
Basophils Absolute: 0.1 10*3/uL (ref 0.0–0.1)
Basophils Relative: 0 %
Eosinophils Absolute: 0.3 10*3/uL (ref 0.0–0.5)
Eosinophils Relative: 2 %
HCT: 23.7 % — ABNORMAL LOW (ref 36.0–46.0)
Hemoglobin: 7.8 g/dL — ABNORMAL LOW (ref 12.0–15.0)
Immature Granulocytes: 6 %
Lymphocytes Relative: 12 %
Lymphs Abs: 1.6 10*3/uL (ref 0.7–4.0)
MCH: 25.2 pg — ABNORMAL LOW (ref 26.0–34.0)
MCHC: 32.9 g/dL (ref 30.0–36.0)
MCV: 76.5 fL — ABNORMAL LOW (ref 80.0–100.0)
Monocytes Absolute: 0.5 10*3/uL (ref 0.1–1.0)
Monocytes Relative: 4 %
Neutro Abs: 10.2 10*3/uL — ABNORMAL HIGH (ref 1.7–7.7)
Neutrophils Relative %: 76 %
Platelets: 412 10*3/uL — ABNORMAL HIGH (ref 150–400)
RBC: 3.1 MIL/uL — ABNORMAL LOW (ref 3.87–5.11)
RDW: 16.8 % — ABNORMAL HIGH (ref 11.5–15.5)
Smear Review: NORMAL
WBC: 13.5 10*3/uL — ABNORMAL HIGH (ref 4.0–10.5)
nRBC: 0 % (ref 0.0–0.2)

## 2021-01-23 LAB — RPR: RPR Ser Ql: NONREACTIVE

## 2021-01-23 SURGERY — ECHOCARDIOGRAM, TRANSESOPHAGEAL
Anesthesia: General

## 2021-01-23 MED ORDER — PROPOFOL 10 MG/ML IV BOLUS
INTRAVENOUS | Status: DC | PRN
Start: 2021-01-23 — End: 2021-01-23
  Administered 2021-01-23: 50 mg via INTRAVENOUS
  Administered 2021-01-23: 20 mg via INTRAVENOUS
  Administered 2021-01-23: 30 mg via INTRAVENOUS
  Administered 2021-01-23: 20 mg via INTRAVENOUS
  Administered 2021-01-23: 50 mg via INTRAVENOUS

## 2021-01-23 MED ORDER — DICLOXACILLIN SODIUM 500 MG PO CAPS
1000.0000 mg | ORAL_CAPSULE | Freq: Four times a day (QID) | ORAL | 0 refills | Status: DC
  Filled 2021-01-23: qty 80, 10d supply, fill #0

## 2021-01-23 MED ORDER — CEPHALEXIN 500 MG PO CAPS
1000.0000 mg | ORAL_CAPSULE | Freq: Four times a day (QID) | ORAL | 0 refills | Status: AC
  Filled 2021-01-23: qty 80, 10d supply, fill #0

## 2021-01-23 MED ORDER — DEXMEDETOMIDINE (PRECEDEX) IN NS 20 MCG/5ML (4 MCG/ML) IV SYRINGE
PREFILLED_SYRINGE | INTRAVENOUS | Status: DC | PRN
  Administered 2021-01-23 (×2): 4 ug via INTRAVENOUS

## 2021-01-23 MED ORDER — LIDOCAINE VISCOUS HCL 2 % MT SOLN
OROMUCOSAL | Status: AC
  Filled 2021-01-23: qty 15

## 2021-01-23 MED ORDER — DICLOXACILLIN SODIUM 500 MG PO CAPS: 1000.0000 mg | ORAL_CAPSULE | Freq: Four times a day (QID) | ORAL | Status: DC

## 2021-01-23 MED ORDER — MIDAZOLAM HCL 2 MG/2ML IJ SOLN
INTRAMUSCULAR | Status: AC
  Filled 2021-01-23: qty 2

## 2021-01-23 MED ORDER — MIDAZOLAM HCL 2 MG/2ML IJ SOLN
INTRAMUSCULAR | Status: DC | PRN
  Administered 2021-01-23: 2 mg via INTRAVENOUS

## 2021-01-23 MED ORDER — BUTAMBEN-TETRACAINE-BENZOCAINE 2-2-14 % EX AERO
INHALATION_SPRAY | CUTANEOUS | Status: AC
  Filled 2021-01-23: qty 5

## 2021-01-23 NOTE — Clinical Social Work Note (Signed)
Per RN, patient is leaving AMA. Prescription sent to [facility]. Gave RN address to give patient and told him they close at 4:30. No further concerns. CSW signing off.  Charlynn Court, CSW [phone]

## 2021-01-23 NOTE — Discharge Instructions (Signed)
Dressing changes: Left leg dressing:  apply dry gauze to cover the incisions and drains, then secure with a Kerlix wrap to go above and below the knee joint so it stays in place and does not slide down.  Then secure with tape.  Change this once daily and as needed to keep the area clean and dry.  Do not attempt to remove the penrose drains. Bilateral forearm wounds:  apply saline moist gauze within the margins of the open wounds, cover with dry gauze and Kerlix wrap.  Secure with tape.  Change this once daily and as needed to keep the area clean and dry.

## 2021-01-23 NOTE — Anesthesia Postprocedure Evaluation (Signed)
Anesthesia Post Note  Patient: Shameeka Silliman  Procedure(s) Performed: TRANSESOPHAGEAL ECHOCARDIOGRAM (TEE)  Patient location during evaluation: PACU Anesthesia Type: General Level of consciousness: awake and alert and oriented Pain management: pain level controlled Vital Signs Assessment: post-procedure vital signs reviewed and stable Respiratory status: spontaneous breathing, nonlabored ventilation and respiratory function stable Cardiovascular status: blood pressure returned to baseline and stable Postop Assessment: no signs of nausea or vomiting Anesthetic complications: no   No notable events documented.   Last Vitals:  Vitals:   01/23/21 0848 01/23/21 0900  BP: 131/78 (!) 161/100  Pulse: 94 100  Resp: 19 18  Temp:    SpO2: 97% 97%    Last Pain:  Vitals:   01/23/21 0749  TempSrc: Oral  PainSc: 3                  Oluwatosin Higginson

## 2021-01-23 NOTE — Progress Notes (Signed)
Full linen change and pt given new gown

## 2021-01-23 NOTE — Progress Notes (Signed)
*  PRELIMINARY RESULTS* Echocardiogram Echocardiogram Transesophageal has been performed.  Cristela Blue 01/23/2021, 9:02 AM

## 2021-01-23 NOTE — Procedures (Signed)
Transesophageal Echocardiogram :  Indication: bacteremia, evaluate for endocarditis  Procedure: 10cc of viscous lidocaine were given orally to provide local anesthesia to the oropharynx. The patient was positioned supine on the left side, bite block provided. The patient was moderately sedated with the doses of versed and fentanyl as detailed below.  Using digital technique an omniplane probe was advanced into the esophagus without incident.   sedation: 1. Sedation administered per anesthesia team  See report in EPIC  for complete details: In brief, imaging revealed normal LV function with no RWMAs and no mural apical thrombus.  .  Estimated ejection fraction was 60%.  Right sided cardiac chambers were normal with no evidence of pulmonary hypertension.  No evidence for vegetation or endocarditis  Imaging of the septum showed no ASD or VSD 2D and color flow confirmed no PFO  The LA was well visualized in orthogonal views.  There was no spontaneous contrast and no thrombus in the LA and LA appendage   The descending thoracic aorta had no  mural aortic debris with no evidence of aneurysmal dilation or disection   Debbe Odea 01/23/2021 8:48 AM

## 2021-01-23 NOTE — Progress Notes (Addendum)
01/23/2021  Subjective: Patient is POD#4 s/p I&D of left lower extremity abscess and debridement of bilateral forearm wounds.  No acute events.  Patient had TEE this morning which did not show any vegetations, showed preserved EF, no thrombus, and no ASD/VSD.  Her WBC is relatively stable.  Denies any pain, only soreness and is ambulating well.    Vital signs: Temp:  [98.2 F (36.8 C)-98.5 F (36.9 C)] 98.5 F (36.9 C) (08/31 0749) Pulse Rate:  [77-118] 87 (08/31 0915) Resp:  [13-23] 17 (08/31 0915) BP: (131-180)/(68-125) 172/99 (08/31 0915) SpO2:  [94 %-100 %] 97 % (08/31 0915)   Intake/Output: 08/30 0701 - 08/31 0700 In: 600 [P.O.:600] Out: -  Last BM Date: 01/20/21  Physical Exam: Constitutional: No acute distress Extremities:  Left lower extremity I&D sites are healing well, with penrose drains in place.  Cellulitis/induration/swelling are resolved.  Dry gauze dressing in place.  Bilateral forearms with wet to dry gauze dressing, overall healing well without necrosis.  Labs:  Recent Labs    01/22/21 0805 01/23/21 0628  WBC 12.7* 13.5*  HGB 7.9* 7.8*  HCT 24.0* 23.7*  PLT 368 412*   Recent Labs    01/22/21 0805 01/23/21 0628  NA 139 139  K 3.8 4.1  CL 105 105  CO2 25 24  GLUCOSE 78 89  BUN 15 12  CREATININE 0.87 0.77  CALCIUM 8.1* 8.4*   No results for input(s): LABPROT, INR in the last 72 hours.  Imaging: No results found.  Assessment/Plan: This is a 30 y.o. female s/p I&D of left lower extremity abscess and debridement of bilateral forearm wounds.  --Patient continue to improve and her cellulitis in left leg has resolved.  Cultures showing Staph Aureus, sensitive to Ancef.  Her forearm wounds are healing well, no further necrosis noted.  From surgical standpoint, should be ok to d/c home today, pending ID recs.   --Instructed patient again on how to do the dressing changes:  Dry gauze and Kerlix wrap on left leg.  Moist to dry gauze dressing with Kerlix  wrap on bilateral forearms.  Moist gauze should only be on the wound and not on skin. --Follow up 1 week to start removing drains. --Dressing instructions and follow up info added to her chart.   Howie Ill, MD [facility]

## 2021-01-23 NOTE — Progress Notes (Addendum)
Pt signed form and left AMA at 1600. Pt going to pick up prescription at [facility] and has address. Pt refused dressing change before discharge. Pt sent home with gauze for dressing changes and provided teachback on how to do dressing change.

## 2021-01-23 NOTE — Discharge Summary (Signed)
[facility] Discharge Summary   Patient: Cindy Cohen BJY:[id number]  PCP: Pcp, No  Date of admission: 01/19/2021   Date of discharge:  01/23/2021     Discharge Diagnoses:  Principal Problem:   Sepsis (HCC) Active Problems:   Abscess of left leg   Open wounds involving multiple regions of upper extremity   IV drug user   Cocaine use   Cellulitis and abscess of left leg   Bacteremia   Admitted From: Home Disposition:  AMA (AGAINST MEDICAL ADVICE)  Recommendations for Outpatient Follow-up:  PCP: in 1 wk Follow up LABS/TEST:  cbc in 1 wk   Follow-up Information     Piscoya, Elita Quick, MD Follow up in 1 week(s).   Specialty: General Surgery Why: Follow up to start removing drains. Contact information: [address] [place] [address] [phone]                Diet recommendation: Regular diet  Activity: The patient is advised to gradually reintroduce usual activities, as tolerated  Discharge Condition: stable  Code Status: Full code   History of present illness: As per the H and P dictated on admission Hospital Course:  30 y.o. female with medical history significant for IV drug use, specifically heroin, cocaine use, Xanax, infrequent EtOH use, presents emergency department for chief concerns of left lower extremity redness and swelling.  Patient was using IV drug abuse, IV heroin, prior to coming to ED, smokes crack cocaine, and also takes unprescribed Xanax.  Patient was admitted, general surgery was consulted and ID was consulted.  Patient had I&D done, patient was found to have MSSA bacteremia. Assessment & Plan: Principal Problem:   Sepsis (HCC) Active Problems:   Abscess of left leg   Open wounds involving multiple regions of upper extremity   IV drug user   Cocaine use   Cellulitis and abscess of left leg # Left lower extremity posterior leg abscess with associated cellulitis, Bilateral upper extremity wounds, Sepsis  secondary to abscess.Multiple sources of leukocytosis, tachycardia, mild increase in lactic acid. S/p vancomycin, cefepime, Flagyl in ED. S/p surgical I&D for left lower extremity abscess. TTE within normal limits. Routine postoperative care. S/p Ancef and Flagyl per ID recommendations. Cardiology consulted, TEE was negative for vegetations, endocarditis was ruled out.  ID recommended IV antibiotics for 7 days followed by oral antibiotics for 7 days.  Per patient refused to stay in the hospital.  Plan was to give dicolxacillin 1 gram  po q6 for 10 days, because of bacteremia, needs higher dose. If she receives [place] then  q 6.  But patient refused to stay for a few hours to get Orbactiv.  ID recommended to discharge on dicloxacillin but it was not available at our pharmacy, patient does not have insurance.  Patient was prescribed Keflex as per ID recommendation.  Patient signed AMA.  Patient was advised to follow with PCP and repeat CBC possible. Staphylococcal bacteremia, In the setting of IV drug use. S/p Ancef and Flagyl. TEE negative for vegetations, endocarditis were ruled out. Polysubstance abuse, IV drug use, Patient was counseled Tobacco use, s/p Nicotine patch and Nicotine gum.  Smoking cessation counseling done Body mass index is 26.24 kg/m.   Nutrition Interventions:    - Patient was instructed, not to drive, operate heavy machinery, perform activities at heights, swimming or participation in water activities or provide baby sitting services while on Pain, Sleep and Anxiety Medications; until her outpatient Physician has advised to do so again.  -  Also recommended to not to take more than prescribed Pain, Sleep and Anxiety Medications.  Patient was ambulatory without any assistance. On the day of the discharge the patient's vitals were stable, but ID recommended IV antibiotics for 7 days followed by oral antibiotics but patient refused to stay and signed AMA  Consultants: General  surgery and ID Procedures: I&D left lower extremity abscess  Discharge Exam: General: Appear in no distress, Oral Mucosa Clear, moist. Cardiovascular: S1 and S2 Present, no Murmur, Respiratory: normal respiratory effort, Bilateral Air entry present and no Crackles, no wheezes Abdomen: Bowel Sound present, Soft and no tenderness, no hernia Extremities: no Pedal edema, no calf tenderness,  skin multiple upper extremity abscesses and left lower extremity abscess s/p I&D  Neurology: alert and oriented to time, place, and person affect appropriate.  Filed Weights   01/19/21 1349  Weight: 59 kg   Vitals:   01/23/21 0900 01/23/21 0915  BP: (!) 161/100 (!) 172/99  Pulse: 100 87  Resp: 18 17  Temp:    SpO2: 97% 97%    DISCHARGE MEDICATION: Allergies as of 01/23/2021   No Known Allergies      Medication List     TAKE these medications    dicloxacillin 500 MG capsule Commonly known as: DYNAPEN Take 2 capsules (1,000 mg total) by mouth every 6 (six) hours for 10 days.               Discharge Care Instructions  (From admission, onward)           Start     Ordered   01/23/21 0000  Discharge wound care:       Comments: Continue dressing   01/23/21 1538           No Known Allergies Discharge Instructions     Call MD for:  extreme fatigue   Complete by: As directed    Call MD for:  persistant dizziness or light-headedness   Complete by: As directed    Call MD for:  severe uncontrolled pain   Complete by: As directed    Call MD for:  temperature >100.4   Complete by: As directed    Diet - low sodium heart healthy   Complete by: As directed    Discharge wound care:   Complete by: As directed    Continue dressing   Increase activity slowly   Complete by: As directed        The results of significant diagnostics from this hospitalization (including imaging, microbiology, ancillary and laboratory) are listed below for reference.    Significant  Diagnostic Studies: US Venous Img Lower Unilateral Left  Result Date: 01/19/2021 CLINICAL DATA:  IV drug use. Abscess or DVT. Swelling and redness of the left lower extremity. EXAM: Left LOWER EXTREMITY VENOUS DOPPLER ULTRASOUND TECHNIQUE: Gray-scale sonography with compression, as well as color and duplex ultrasound, were performed to evaluate the deep venous system(s) from the level of the common femoral vein through the popliteal and proximal calf veins. COMPARISON:  None. FINDINGS: VENOUS Normal compressibility of the common femoral, superficial femoral, and popliteal veins, as well as the visualized calf veins. Visualized portions of profunda femoral vein and great saphenous vein unremarkable. No filling defects to suggest DVT on grayscale or color Doppler imaging. Doppler waveforms show normal direction of venous flow, normal respiratory plasticity and response to augmentation. OTHER There is a 5.4 x 2.7 x 6 cm heterogeneous isoechoic fluid collection within the popliteal fossa that extends inferiorly to  the proximal calf. Limitations: none IMPRESSION: 1. No left lower extremity deep venous thrombosis. 2. A 5.4 x 2.7 x 6 cm heterogeneous isoechoic fluid collection within the left popliteal fossa extends inferiorly to the proximal calf. Finding may represent a liquified hematoma versus abscess. Electronically Signed   By: Tish Frederickson M.D.   On: 01/19/2021 16:20   DG Chest Port 1 View  Result Date: 01/19/2021 CLINICAL DATA:  Questionable sepsis. EXAM: PORTABLE CHEST 1 VIEW COMPARISON:  None. FINDINGS: The heart, hila, and mediastinum are normal. No pneumothorax. No nodules or masses. No focal infiltrates. IMPRESSION: No active disease. Electronically Signed   By: Gerome Sam III M.D.   On: 01/19/2021 15:18   ECHOCARDIOGRAM COMPLETE  Result Date: 01/21/2021    ECHOCARDIOGRAM REPORT   Patient Name:   Cindy Cohen Date of Exam: 01/21/2021 Medical Rec #:  [id number]        Height:       59.0 in  Accession #:    [id number]       Weight:       130.0 lb Date of Birth:  12/23/1990         BSA:          1.536 m Patient Age:    30 years         BP:           127/92 mmHg Patient Gender: F                HR:           97 bpm. Exam Location:  [facility] Procedure: 2D Echo, Cardiac Doppler and Color Doppler Indications:     Bacteremia R78.81  History:         Patient has no prior history of Echocardiogram examinations. IV                  drug user.  Sonographer:     Cristela Blue Referring Phys:  [id number] Tresa Moore Diagnosing Phys: Lorine Bears MD  Sonographer Comments: Suboptimal apical window and no subcostal window. IMPRESSIONS  1. Left ventricular ejection fraction, by estimation, is 60 to 65%. The left ventricle has normal function. The left ventricle has no regional wall motion abnormalities. Left ventricular diastolic parameters were normal.  2. Right ventricular systolic function is normal. The right ventricular size is normal.  3. The mitral valve is normal in structure. Mild mitral valve regurgitation. No evidence of mitral stenosis.  4. The aortic valve is normal in structure. Aortic valve regurgitation is not visualized. No aortic stenosis is present. Conclusion(s)/Recommendation(s): No evidence of valvular vegetations on this transthoracic echocardiogram. FINDINGS  Left Ventricle: Left ventricular ejection fraction, by estimation, is 60 to 65%. The left ventricle has normal function. The left ventricle has no regional wall motion abnormalities. The left ventricular internal cavity size was normal in size. There is  no left ventricular hypertrophy. Left ventricular diastolic parameters were normal. Right Ventricle: The right ventricular size is normal. No increase in right ventricular wall thickness. Right ventricular systolic function is normal. Left Atrium: Left atrial size was normal in size. Right Atrium: Right atrial size was normal in size. Pericardium: Trivial pericardial effusion is present.  Mitral Valve: The mitral valve is normal in structure. Mild mitral valve regurgitation. No evidence of mitral valve stenosis. Tricuspid Valve: The tricuspid valve is normal in structure. Tricuspid valve regurgitation is trivial. No evidence of tricuspid stenosis. Aortic Valve: The aortic valve is normal in structure. Aortic  valve regurgitation is not visualized. No aortic stenosis is present. Aortic valve mean gradient measures 6.5 mmHg. Aortic valve peak gradient measures 12.5 mmHg. Aortic valve area, by VTI measures 2.64 cm. Pulmonic Valve: The pulmonic valve was normal in structure. Pulmonic valve regurgitation is not visualized. No evidence of pulmonic stenosis. Aorta: The aortic root is normal in size and structure. Venous: The inferior vena cava was not well visualized. IAS/Shunts: No atrial level shunt detected by color flow Doppler.  LEFT VENTRICLE PLAX 2D LVIDd:         4.84 cm  Diastology LVIDs:         2.82 cm  LV e' medial:    14.40 cm/s LV PW:         0.91 cm  LV E/e' medial:  7.5 LV IVS:        0.79 cm  LV e' lateral:   15.30 cm/s LVOT diam:     2.10 cm  LV E/e' lateral: 7.1 LV SV:         81 LV SV Index:   53 LVOT Area:     3.46 cm  RIGHT VENTRICLE RV Basal diam:  2.87 cm RV S prime:     15.90 cm/s TAPSE (M-mode): 4.8 cm LEFT ATRIUM             Index       RIGHT ATRIUM           Index LA diam:        3.60 cm 2.34 cm/m  RA Area:     19.20 cm LA Vol (A2C):   75.1 ml 48.90 ml/m RA Volume:   58.60 ml  38.16 ml/m LA Vol (A4C):   74.4 ml 48.44 ml/m LA Biplane Vol: 75.1 ml 48.90 ml/m  AORTIC VALVE                    PULMONIC VALVE AV Area (Vmax):    2.12 cm     PV Vmax:        0.77 m/s AV Area (Vmean):   2.24 cm     PV Peak grad:   2.4 mmHg AV Area (VTI):     2.64 cm     RVOT Peak grad: 5 mmHg AV Vmax:           176.50 cm/s AV Vmean:          122.000 cm/s AV VTI:            0.308 m AV Peak Grad:      12.5 mmHg AV Mean Grad:      6.5 mmHg LVOT Vmax:         108.00 cm/s LVOT Vmean:        78.800  cm/s LVOT VTI:          0.235 m LVOT/AV VTI ratio: 0.76  AORTA Ao Root diam: 2.83 cm MITRAL VALVE                TRICUSPID VALVE MV Area (PHT): 7.44 cm     TR Peak grad:   22.1 mmHg MV Decel Time: 102 msec     TR Vmax:        235.00 cm/s MV E velocity: 108.00 cm/s MV A velocity: 138.00 cm/s  SHUNTS MV E/A ratio:  0.78         Systemic VTI:  0.24 m  Systemic Diam: 2.10 cm Lorine Bears MD Electronically signed by Lorine Bears MD Signature Date/Time: 01/21/2021/1:32:27 PM    Final    ECHO TEE  Result Date: 01/23/2021    TRANSESOPHOGEAL ECHO REPORT   Patient Name:   Cindy Cohen Date of Exam: 01/23/2021 Medical Rec #:  [id number]        Height:       59.0 in Accession #:    [id number]       Weight:       130.0 lb Date of Birth:  02/14/1991         BSA:          1.536 m Patient Age:    30 years         BP:           131/78 mmHg Patient Gender: F                HR:           94 bpm. Exam Location:  [facility] Procedure: Transesophageal Echo, Cardiac Doppler and Color Doppler Indications:     Bacteremia R78.81  History:         Patient has prior history of Echocardiogram examinations, most                  recent 01/21/2021. IV drug user.  Sonographer:     Cristela Blue Referring Phys:  [id number] Raymon Mutton DUNN Diagnosing Phys: Debbe Odea MD PROCEDURE: The transesophogeal probe was passed without difficulty through the esophogus of the patient. Sedation performed by different physician. The patient developed no complications during the procedure. IMPRESSIONS  1. Left ventricular ejection fraction, by estimation, is 60 to 65%. The left ventricle has normal function.  2. Right ventricular systolic function is normal. The right ventricular size is normal.  3. No left atrial/left atrial appendage thrombus was detected.  4. The mitral valve is normal in structure. Trivial mitral valve regurgitation.  5. The aortic valve is tricuspid. Aortic valve regurgitation is not visualized.  Conclusion(s)/Recommendation(s): No evidence of vegetation/infective endocarditis on this transesophageal echocardiogram. FINDINGS  Left Ventricle: Left ventricular ejection fraction, by estimation, is 60 to 65%. The left ventricle has normal function. The left ventricular internal cavity size was normal in size. Right Ventricle: The right ventricular size is normal. No increase in right ventricular wall thickness. Right ventricular systolic function is normal. Left Atrium: Left atrial size was normal in size. No left atrial/left atrial appendage thrombus was detected. Right Atrium: Right atrial size was normal in size. Pericardium: There is no evidence of pericardial effusion. Mitral Valve: The mitral valve is normal in structure. Trivial mitral valve regurgitation. Tricuspid Valve: The tricuspid valve is normal in structure. Tricuspid valve regurgitation is trivial. Aortic Valve: The aortic valve is tricuspid. Aortic valve regurgitation is not visualized. Pulmonic Valve: The pulmonic valve was grossly normal. Pulmonic valve regurgitation is not visualized. Aorta: The aortic root is normal in size and structure. IAS/Shunts: No atrial level shunt detected by color flow Doppler. Debbe Odea MD Electronically signed by Debbe Odea MD Signature Date/Time: 01/23/2021/2:37:57 PM    Final    CT EXTREMITY LOWER LEFT W CONTRAST  Result Date: 01/19/2021 CLINICAL DATA:  Left leg infection.  History of IV drug use. EXAM: CT OF THE LOWER LEFT EXTREMITY WITH CONTRAST TECHNIQUE: Multidetector CT imaging of the lower left extremity was performed according to the standard protocol following intravenous contrast administration. CONTRAST:  53mL OMNIPAQUE IOHEXOL 350 MG/ML SOLN COMPARISON:  Left lower extremity ultrasound from same day. FINDINGS: Bones/Joint/Cartilage No fracture or dislocation. Joint spaces are preserved. No joint effusion. Ligaments Ligaments are suboptimally evaluated by CT. Muscles and Tendons  Grossly intact. Soft tissue Ill-defined partially rim enhancing fluid collection involving the posteromedial knee, measuring approximately 8.4 x 3.2 x 12.6 cm (series 5, image 303; series 8, image 86). The fluid collection involves the deep compartment between the pes anserine tendons and semimembranosus tendon with extension into the proximal lower leg along the medial gastrocnemius muscle. No subcutaneous emphysema. Scattered soft tissue fat stranding and fluid extending from the mid thigh to the ankle. Circumferential skin thickening of the lower leg. No soft tissue mass.  Reactive left inguinal lymphadenopathy. IMPRESSION: 1. Ill-defined abscess of the posteromedial knee, measuring approximately 8.4 x 3.2 x 12.6 cm. The fluid collection involves the deep compartment between the pes anserine tendons and semimembranosus tendon with extension into the proximal lower leg along the medial gastrocnemius muscle. 2. Scattered soft tissue fat stranding and fluid extending from the mid thigh to the ankle, consistent with cellulitis. No subcutaneous emphysema. 3. No acute osseous abnormality. Electronically Signed   By: Obie DredgeWilliam T Derry M.D.   On: 01/19/2021 17:28    Microbiology: Recent Results (from the past 240 hour(s))  Blood Culture (routine x 2)     Status: None (Preliminary result)   Collection Time: 01/19/21  1:55 PM   Specimen: BLOOD  Result Value Ref Range Status   Specimen Description BLOOD RT FOOT  Final   Special Requests Blood Culture adequate volume  Final   Culture   Final    NO GROWTH 4 DAYS Performed at [facility], [address] [place], [place] [address]    Report Status PENDING  Incomplete  Urine Culture     Status: Abnormal   Collection Time: 01/19/21  2:41 PM   Specimen: Urine, Clean Catch  Result Value Ref Range Status   Specimen Description   Final    URINE, CLEAN CATCH Performed at [facility], [address] [place], [place] [address]    Special  Requests   Final    NONE Performed at [facility], [address] [place], [place] [address]    Culture >=100,000 COLONIES/mL ESCHERICHIA COLI (A)  Final   Report Status 01/21/2021 FINAL  Final   Organism ID, Bacteria ESCHERICHIA COLI (A)  Final      Susceptibility   Escherichia coli - MIC*    AMPICILLIN 8 SENSITIVE Sensitive     CEFAZOLIN <=4 SENSITIVE Sensitive     CEFEPIME <=0.12 SENSITIVE Sensitive     CEFTRIAXONE <=0.25 SENSITIVE Sensitive     CIPROFLOXACIN <=0.25 SENSITIVE Sensitive     GENTAMICIN <=1 SENSITIVE Sensitive     IMIPENEM <=0.25 SENSITIVE Sensitive     NITROFURANTOIN <=16 SENSITIVE Sensitive     TRIMETH/SULFA <=20 SENSITIVE Sensitive     AMPICILLIN/SULBACTAM <=2 SENSITIVE Sensitive     PIP/TAZO <=4 SENSITIVE Sensitive     * >=100,000 COLONIES/mL ESCHERICHIA COLI  Resp Panel by RT-PCR (Flu A&B, Covid) Nasopharyngeal Swab     Status: None   Collection Time: 01/19/21  3:07 PM   Specimen: Nasopharyngeal Swab; Nasopharyngeal(NP) swabs in vial transport medium  Result Value Ref Range Status   SARS Coronavirus 2 by RT PCR NEGATIVE NEGATIVE Final    Comment: (NOTE) SARS-CoV-2 target nucleic acids are NOT DETECTED.  The SARS-CoV-2 RNA is generally detectable in upper respiratory specimens during the acute phase of infection. The lowest  concentration of SARS-CoV-2 viral copies this assay can detect is 138 copies/mL. A negative result does not preclude SARS-Cov-2 infection and should not be used as the sole basis for treatment or other patient management decisions. A negative result may occur with  improper specimen collection/handling, submission of specimen other than nasopharyngeal swab, presence of viral mutation(s) within the areas targeted by this assay, and inadequate number of viral copies(<138 copies/mL). A negative result must be combined with clinical observations, patient history, and epidemiological information. The expected result is  Negative.  Fact Sheet for Patients:  BloggerCourse.com  Fact Sheet for Healthcare Providers:  SeriousBroker.it  This test is no t yet approved or cleared by the [place] FDA and  has been authorized for detection and/or diagnosis of SARS-CoV-2 by FDA under an Emergency Use Authorization (EUA). This EUA will remain  in effect (meaning this test can be used) for the duration of the COVID-19 declaration under Section 564(b)(1) of the Act, 21 U.S.C.section 360bbb-3(b)(1), unless the authorization is terminated  or revoked sooner.       Influenza A by PCR NEGATIVE NEGATIVE Final   Influenza B by PCR NEGATIVE NEGATIVE Final    Comment: (NOTE) The Xpert Xpress SARS-CoV-2/FLU/RSV plus assay is intended as an aid in the diagnosis of influenza from Nasopharyngeal swab specimens and should not be used as a sole basis for treatment. Nasal washings and aspirates are unacceptable for Xpert Xpress SARS-CoV-2/FLU/RSV testing.  Fact Sheet for Patients: BloggerCourse.com  Fact Sheet for Healthcare Providers: SeriousBroker.it  This test is not yet approved or cleared by the [place] FDA and has been authorized for detection and/or diagnosis of SARS-CoV-2 by FDA under an Emergency Use Authorization (EUA). This EUA will remain in effect (meaning this test can be used) for the duration of the COVID-19 declaration under Section 564(b)(1) of the Act, 21 U.S.C. section 360bbb-3(b)(1), unless the authorization is terminated or revoked.  Performed at [facility], [address] [place], [place] [address]   Blood Culture (routine x 2)     Status: Abnormal   Collection Time: 01/19/21  3:09 PM   Specimen: BLOOD  Result Value Ref Range Status   Specimen Description   Final    BLOOD  BLRA Performed at [facility], [address] [place], [place] [address]    Special  Requests   Final    Blood Culture results may not be optimal due to an inadequate volume of blood received in culture bottles Performed at [facility], [address] [place], [place] [address]    Culture  Setup Time   Final    Organism ID to follow IN BOTH AEROBIC AND ANAEROBIC BOTTLES GRAM POSITIVE COCCI CRITICAL RESULT CALLED TO, READ BACK BY AND VERIFIED WITH: ABBY ELLINGTON AT 0722 01/20/21.PMF Performed at [facility], [address] [place], [place] [address]    Culture STAPHYLOCOCCUS AUREUS (A)  Final   Report Status 01/22/2021 FINAL  Final   Organism ID, Bacteria STAPHYLOCOCCUS AUREUS  Final      Susceptibility   Staphylococcus aureus - MIC*    CIPROFLOXACIN <=0.5 SENSITIVE Sensitive     ERYTHROMYCIN >=8 RESISTANT Resistant     GENTAMICIN <=0.5 SENSITIVE Sensitive     OXACILLIN 0.5 SENSITIVE Sensitive     TETRACYCLINE <=1 SENSITIVE Sensitive     VANCOMYCIN 1 SENSITIVE Sensitive     TRIMETH/SULFA <=10 SENSITIVE Sensitive     CLINDAMYCIN <=0.25 SENSITIVE Sensitive     RIFAMPIN <=0.5 SENSITIVE  Sensitive     Inducible Clindamycin NEGATIVE Sensitive     * STAPHYLOCOCCUS AUREUS  Blood Culture ID Panel (Reflexed)     Status: Abnormal   Collection Time: 01/19/21  3:09 PM  Result Value Ref Range Status   Enterococcus faecalis NOT DETECTED NOT DETECTED Final   Enterococcus Faecium NOT DETECTED NOT DETECTED Final   Listeria monocytogenes NOT DETECTED NOT DETECTED Final   Staphylococcus species DETECTED (A) NOT DETECTED Final    Comment: CRITICAL RESULT CALLED TO, READ BACK BY AND VERIFIED WITH: ABBY ELLINGTON AT 0722 01/20/21.PMF    Staphylococcus aureus (BCID) DETECTED (A) NOT DETECTED Final    Comment: CRITICAL RESULT CALLED TO, READ BACK BY AND VERIFIED WITH: ABBY ELLINGTON AT 0722 01/20/21.PMF    Staphylococcus epidermidis NOT DETECTED NOT DETECTED Final   Staphylococcus lugdunensis NOT DETECTED NOT DETECTED Final   Streptococcus species NOT DETECTED  NOT DETECTED Final   Streptococcus agalactiae NOT DETECTED NOT DETECTED Final   Streptococcus pneumoniae NOT DETECTED NOT DETECTED Final   Streptococcus pyogenes NOT DETECTED NOT DETECTED Final   A.calcoaceticus-baumannii NOT DETECTED NOT DETECTED Final   Bacteroides fragilis NOT DETECTED NOT DETECTED Final   Enterobacterales NOT DETECTED NOT DETECTED Final   Enterobacter cloacae complex NOT DETECTED NOT DETECTED Final   Escherichia coli NOT DETECTED NOT DETECTED Final   Klebsiella aerogenes NOT DETECTED NOT DETECTED Final   Klebsiella oxytoca NOT DETECTED NOT DETECTED Final   Klebsiella pneumoniae NOT DETECTED NOT DETECTED Final   Proteus species NOT DETECTED NOT DETECTED Final   Salmonella species NOT DETECTED NOT DETECTED Final   Serratia marcescens NOT DETECTED NOT DETECTED Final   Haemophilus influenzae NOT DETECTED NOT DETECTED Final   Neisseria meningitidis NOT DETECTED NOT DETECTED Final   Pseudomonas aeruginosa NOT DETECTED NOT DETECTED Final   Stenotrophomonas maltophilia NOT DETECTED NOT DETECTED Final   Candida albicans NOT DETECTED NOT DETECTED Final   Candida auris NOT DETECTED NOT DETECTED Final   Candida glabrata NOT DETECTED NOT DETECTED Final   Candida krusei NOT DETECTED NOT DETECTED Final   Candida parapsilosis NOT DETECTED NOT DETECTED Final   Candida tropicalis NOT DETECTED NOT DETECTED Final   Cryptococcus neoformans/gattii NOT DETECTED NOT DETECTED Final   Meth resistant mecA/C and MREJ NOT DETECTED NOT DETECTED Final    Comment: Performed at [facility], [address] [place], [place] [address]  Aerobic/Anaerobic Culture w Gram Stain (surgical/deep wound)     Status: None (Preliminary result)   Collection Time: 01/19/21  7:01 PM   Specimen: Wound  Result Value Ref Range Status   Specimen Description   Final    ABSCESS LOWER LEFT LEG Performed at [facility], [address] [place], [place] [address]    Special Requests   Final     NONE Performed at [facility], [address] [place], [place] [address]    Gram Stain   Final    RARE SQUAMOUS EPITHELIAL CELLS PRESENT FEW WBC SEEN FEW GRAM POSITIVE COCCI Performed at [facility], [address], [place], [place] [address]    Culture   Final    MODERATE STAPHYLOCOCCUS AUREUS NO ANAEROBES ISOLATED; CULTURE IN PROGRESS FOR 5 DAYS    Report Status PENDING  Incomplete   Organism ID, Bacteria STAPHYLOCOCCUS AUREUS  Final      Susceptibility   Staphylococcus aureus - MIC*    CIPROFLOXACIN <=0.5 SENSITIVE Sensitive     ERYTHROMYCIN >=8 RESISTANT Resistant     GENTAMICIN <=  0.5 SENSITIVE Sensitive     OXACILLIN 0.5 SENSITIVE Sensitive     TETRACYCLINE <=1 SENSITIVE Sensitive     VANCOMYCIN 1 SENSITIVE Sensitive     TRIMETH/SULFA <=10 SENSITIVE Sensitive     CLINDAMYCIN <=0.25 SENSITIVE Sensitive     RIFAMPIN <=0.5 SENSITIVE Sensitive     Inducible Clindamycin NEGATIVE Sensitive     * MODERATE STAPHYLOCOCCUS AUREUS  CULTURE, BLOOD (ROUTINE X 2) w Reflex to ID Panel     Status: None (Preliminary result)   Collection Time: 01/22/21  7:21 AM   Specimen: BLOOD  Result Value Ref Range Status   Specimen Description BLOOD LEFT HAND  Final   Special Requests   Final    BOTTLES DRAWN AEROBIC AND ANAEROBIC Blood Culture results may not be optimal due to an inadequate volume of blood received in culture bottles   Culture   Final    NO GROWTH < 24 HOURS Performed at [facility], [address] [place], [place] [address]    Report Status PENDING  Incomplete  Culture, blood (Routine X 2) w Reflex to ID Panel     Status: None (Preliminary result)   Collection Time: 01/22/21  7:21 AM   Specimen: BLOOD  Result Value Ref Range Status   Specimen Description BLOOD LEFT WRIST  Final   Special Requests   Final    BOTTLES DRAWN AEROBIC AND ANAEROBIC Blood Culture results may not be optimal due to an inadequate volume of blood received in culture bottles    Culture   Final    NO GROWTH < 24 HOURS Performed at [facility], [address] [place], [place] [address]    Report Status PENDING  Incomplete     Labs: CBC: Recent Labs  Lab 01/19/21 1355 01/20/21 0510 01/21/21 0429 01/22/21 0805 01/23/21 0628  WBC 24.8* 25.2* 23.0* 12.7* 13.5*  NEUTROABS 20.5*  --   --  8.9* 10.2*  HGB 8.5* 7.7* 8.8* 7.9* 7.8*  HCT 26.5* 23.7* 27.2* 24.0* 23.7*  MCV 76.6* 79.0* 75.1* 77.2* 76.5*  PLT 443* 402* 358 368 412*   Basic Metabolic Panel: Recent Labs  Lab 01/19/21 1355 01/20/21 0510 01/22/21 0805 01/23/21 0628  NA 140 138 139 139  K 4.8 4.5 3.8 4.1  CL 107 107 105 105  CO2 24 24 25 24   GLUCOSE 156* 188* 78 89  BUN 18 12 15 12   CREATININE 0.79 0.73 0.87 0.77  CALCIUM 8.4* 7.8* 8.1* 8.4*   Liver Function Tests: Recent Labs  Lab 01/19/21 1355  AST 15  ALT 13  ALKPHOS 110  BILITOT 0.2*  PROT 6.3*  ALBUMIN 2.4*   No results for input(s): LIPASE, AMYLASE in the last 168 hours. No results for input(s): AMMONIA in the last 168 hours. Cardiac Enzymes: Recent Labs  Lab 01/19/21 2227  CKTOTAL 95   BNP (last 3 results) No results for input(s): BNP in the last 8760 hours. CBG: No results for input(s): GLUCAP in the last 168 hours.  Time spent: 35 minutes  Signed:  01/21/21  [facility]  01/23/2021 3:38 PM

## 2021-01-23 NOTE — Anesthesia Preprocedure Evaluation (Signed)
Anesthesia Evaluation  Patient identified by MRN, date of birth, ID band Patient awake    Reviewed: Allergy & Precautions, NPO status , Patient's Chart, lab work & pertinent test results  History of Anesthesia Complications Negative for: history of anesthetic complications  Airway Mallampati: II  TM Distance: >3 FB Neck ROM: Full    Dental  (+) Poor Dentition   Pulmonary neg sleep apnea, neg COPD, Current Smoker and Patient abstained from smoking.,    breath sounds clear to auscultation- rhonchi (-) wheezing      Cardiovascular Exercise Tolerance: Good (-) hypertension(-) angina(-) CAD, (-) Past MI and (-) Cardiac Stents  Rhythm:Regular Rate:Normal - Systolic murmurs and - Diastolic murmurs    Neuro/Psych neg Seizures negative neurological ROS  negative psych ROS   GI/Hepatic negative GI ROS, Neg liver ROS,   Endo/Other  negative endocrine ROSneg diabetes  Renal/GU negative Renal ROS     Musculoskeletal negative musculoskeletal ROS (+)   Abdominal (+) - obese,   Peds  Hematology negative hematology ROS (+)   Anesthesia Other Findings Past Medical History: 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   Reproductive/Obstetrics                             Anesthesia Physical Anesthesia Plan  ASA: 2  Anesthesia Plan: General   Post-op Pain Management:    Induction: Intravenous  PONV Risk Score and Plan: 1 and Propofol infusion  Airway Management Planned: Natural Airway  Additional Equipment:   Intra-op Plan:   Post-operative Plan:   Informed Consent: I have reviewed the patients History and Physical, chart, labs and discussed the procedure including the risks, benefits and alternatives for the proposed anesthesia with the patient or authorized representative who has indicated his/her understanding and acceptance.     Dental advisory given  Plan Discussed with: CRNA and  Anesthesiologist  Anesthesia Plan Comments:         Anesthesia Quick Evaluation

## 2021-01-23 NOTE — Transfer of Care (Signed)
Immediate Anesthesia Transfer of Care Note  Patient: Cindy Cohen  Procedure(s) Performed: TRANSESOPHAGEAL ECHOCARDIOGRAM (TEE)  Patient Location: Short Stay  Anesthesia Type:General  Level of Consciousness: drowsy  Airway & Oxygen Therapy: Patient Spontanous Breathing and Patient connected to nasal cannula oxygen  Post-op Assessment: Report given to RN and Post -op Vital signs reviewed and stable  Post vital signs: Reviewed and stable  Last Vitals:  Vitals Value Taken Time  BP    Temp    Pulse 93 01/23/21 0848  Resp 21 01/23/21 0848  SpO2 97 % 01/23/21 0848  Vitals shown include unvalidated device data.  Last Pain:  Vitals:   01/23/21 0749  TempSrc: Oral  PainSc: 3       Patients Stated Pain Goal: 0 (01/23/21 0413)  Complications: No notable events documented.

## 2021-01-24 LAB — CULTURE, BLOOD (ROUTINE X 2)
Culture: NO GROWTH
Special Requests: ADEQUATE

## 2021-01-25 LAB — AEROBIC/ANAEROBIC CULTURE W GRAM STAIN (SURGICAL/DEEP WOUND)

## 2021-01-25 LAB — HCV RNA BY PCR, QN RFX GENO
HCV RNA Qnt(log copy/mL): UNDETERMINED log10 IU/mL
HepC Qn: NOT DETECTED IU/mL

## 2021-01-29 LAB — CULTURE, BLOOD (ROUTINE X 2)
Culture: NO GROWTH
Culture: NO GROWTH

## 2021-02-01 ENCOUNTER — Encounter: Payer: Self-pay | Admitting: Physician Assistant

## 2021-02-18 ENCOUNTER — Encounter: Payer: Self-pay | Admitting: Physician Assistant

## 2021-02-19 ENCOUNTER — Telehealth: Payer: Self-pay | Admitting: *Deleted

## 2021-02-19 NOTE — Telephone Encounter (Signed)
Message left for the patient, Per Lynden Oxford, PA-C she should not need anymore antibiotics. She will be seen on October 3rd for a post operative appointment.

## 2021-02-19 NOTE — Telephone Encounter (Signed)
Patient called and wanted to see if she can get another refill on Kelfex. She had surgery on 01/19/21 Dr.Piscoya I&D wound left lower leg

## 2021-02-25 ENCOUNTER — Encounter: Payer: Self-pay | Admitting: Surgery

## 2021-02-27 ENCOUNTER — Encounter: Payer: Self-pay | Admitting: Surgery

## 2021-03-06 ENCOUNTER — Encounter: Payer: Self-pay | Admitting: Physician Assistant

## 2021-03-06 ENCOUNTER — Other Ambulatory Visit: Payer: Self-pay

## 2021-03-06 ENCOUNTER — Ambulatory Visit (INDEPENDENT_AMBULATORY_CARE_PROVIDER_SITE_OTHER): Payer: Self-pay | Admitting: Physician Assistant

## 2021-03-06 ENCOUNTER — Ambulatory Visit: Payer: Self-pay | Admitting: Surgery

## 2021-03-06 VITALS — BP 126/79 | HR 87 | Temp 98.4°F | Ht 60.0 in

## 2021-03-06 DIAGNOSIS — F1110 Opioid abuse, uncomplicated: Secondary | ICD-10-CM

## 2021-03-06 DIAGNOSIS — F1990 Other psychoactive substance use, unspecified, uncomplicated: Secondary | ICD-10-CM

## 2021-03-06 DIAGNOSIS — Z09 Encounter for follow-up examination after completed treatment for conditions other than malignant neoplasm: Secondary | ICD-10-CM

## 2021-03-06 DIAGNOSIS — S41101D Unspecified open wound of right upper arm, subsequent encounter: Secondary | ICD-10-CM

## 2021-03-06 DIAGNOSIS — S41109A Unspecified open wound of unspecified upper arm, initial encounter: Secondary | ICD-10-CM

## 2021-03-06 DIAGNOSIS — R7881 Bacteremia: Secondary | ICD-10-CM

## 2021-03-06 DIAGNOSIS — L02416 Cutaneous abscess of left lower limb: Secondary | ICD-10-CM

## 2021-03-06 MED ORDER — DOXYCYCLINE HYCLATE 100 MG PO CAPS
100.0000 mg | ORAL_CAPSULE | Freq: Two times a day (BID) | ORAL | 0 refills | Status: AC
  Filled 2021-03-06: qty 20, 10d supply, fill #0

## 2021-03-06 NOTE — Progress Notes (Signed)
[facility] POST-OP OFFICE VISIT  03/06/2021  HPI: Cindy Cohen is a 30 y.o. female 1.5 months s/p incision and drainage of deep subcutaneous and subfascial left lower extremity abscess and debridement bilateral forearm chronic wounds, each approximately 40 cm2 with Dr Aleen Campi.   She presents today for follow up. 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 Her left forearm wound has healed well. She also still has her penrose drains in her left popliteal wound. No fevers. She does not have wound supplies.   Vital signs: BP 126/79   Pulse 87   Temp 98.4 F (36.9 C) (Oral)   Ht 5' (1.524 m)   LMP 02/25/2021   SpO2 98%   BMI 25.39 kg/m    Physical Exam: Constitutional: Appears older than stated age, NAD Skin: She has three penrose drain in her left popliteal area, these were removed. No erythema or drainage. Her left forearm wound has no healed and is approximately 4 x 2 cm and completely scabbed over. She still has a rather large right forearm wound, the wound bed is raw with areas of fibrinous tissue and eschar, there surrounding tissue is erythematous. No obvious underlying abscess nor necrosis   Right Forearm Wound (03/06/2021):     Assessment/Plan: This is a 30 y.o. female 1.5 months s/p incision and drainage of deep subcutaneous and subfascial left lower extremity abscess and debridement bilateral forearm chronic wounds, each approximately 40 cm2   - I will send her with 100 mg of Doxycycline BID for 10 days  - We reviewed wound care; provided with supplies   - Will refer to wound care center as well  - Encouraged cessation of drub use  - I will see her again in 2 weeks or sooner; I am worried if she continues to use, she will need another debridement.   -- Lynden Oxford, PA-C [facility] 03/06/2021, 5:45  PM [phone] M-F: 7am - 4pm

## 2021-03-06 NOTE — Patient Instructions (Addendum)
Please pick up your prescription at your pharmacy. A referral has been placed for the wound center. They will call you for an appointment.   If you have any concerns or questions, please feel free to call our office. See follow up appointment.   Wound Care, Adult Taking care of your wound properly can help to prevent pain, infection, and scarring. It can also help your wound heal more quickly. Follow instructions from your health care provider about how to care for your wound. Supplies needed: Soap and water. Wound cleanser. Gauze. If needed, a clean bandage (dressing) or other type of wound dressing material to cover or place in the wound. Follow your health care provider's instructions about what dressing supplies to use. Cream or ointment to apply to the wound, if told by your health care provider. How to care for your wound Cleaning the wound Ask your health care provider how to clean the wound. This may include: Using mild soap and water or a wound cleanser. Using a clean gauze to pat the wound dry after cleaning it. Do not rub or scrub the wound. Dressing care Wash your hands with soap and water for at least 20 seconds before and after you change the dressing. If soap and water are not available, use hand sanitizer. Change your dressing as told by your health care provider. This may include: Cleaning or rinsing out (irrigating) the wound. Placing a dressing over the wound or in the wound (packing). Covering the wound with an outer dressing. Leave any stitches (sutures), skin glue, or adhesive strips in place. These skin closures may need to stay in place for 2 weeks or longer. If adhesive strip edges start to loosen and curl up, you may trim the loose edges. Do not remove adhesive strips completely unless your health care provider tells you to do that. Ask your health care provider when you can leave the wound uncovered. Checking for infection Check your wound area every day for signs  of infection. Check for: More redness, swelling, or pain. Fluid or blood. Warmth. Pus or a bad smell.  Follow these instructions at home Medicines If you were prescribed an antibiotic medicine, cream, or ointment, take or apply it as told by your health care provider. Do not stop using the antibiotic even if your condition improves. If you were prescribed pain medicine, take it 30 minutes before you do any wound care or as told by your health care provider. Take over-the-counter and prescription medicines only as told by your health care provider. Eating and drinking Eat a diet that includes protein, vitamin A, vitamin C, and other nutrient-rich foods to help the wound heal. Foods rich in protein include meat, fish, eggs, dairy, beans, and nuts. Foods rich in vitamin A include carrots and dark green, leafy vegetables. Foods rich in vitamin C include citrus fruits, tomatoes, broccoli, and peppers. Drink enough fluid to keep your urine pale yellow. General instructions Do not take baths, swim, use a hot tub, or do anything that would put the wound underwater until your health care provider approves. Ask your health care provider if you may take showers. You may only be allowed to take sponge baths. Do not scratch or pick at the wound. Keep it covered as told by your health care provider. Return to your normal activities as told by your health care provider. Ask your health care provider what activities are safe for you. Protect your wound from the sun when you are outside for the  first 6 months, or for as long as told by your health care provider. Cover up the scar area or apply sunscreen that has an SPF of at least 30. Do not use any products that contain nicotine or tobacco, such as cigarettes, e-cigarettes, and chewing tobacco. These may delay wound healing. If you need help quitting, ask your health care provider. Keep all follow-up visits as told by your health care provider. This is  important. Contact a health care provider if: You received a tetanus shot and you have swelling, severe pain, redness, or bleeding at the injection site. Your pain is not controlled with medicine. You have any of these signs of infection: More redness, swelling, or pain around the wound. Fluid or blood coming from the wound. Warmth coming from the wound. Pus or a bad smell coming from the wound. A fever or chills. You are nauseous or you vomit. You are dizzy. Get help right away if: You have a red streak of skin near the area around your wound. Your wound has been closed with staples, sutures, skin glue, or adhesive strips and it begins to open up and separate. Your wound is bleeding, and the bleeding does not stop with gentle pressure. You have a rash. You faint. You have trouble breathing. These symptoms may represent a serious problem that is an emergency. Do not wait to see if the symptoms will go away. Get medical help right away. Call your local emergency services (911 in the [place] Do not drive yourself to the hospital. Summary Always wash your hands with soap and water for at least 20 seconds before and after changing your dressing. Change your dressing as told by your health care provider. To help with healing, eat foods that are rich in protein, vitamin A, vitamin C, and other nutrients. Check your wound every day for signs of infection. Contact your health care provider if you suspect that your wound is infected. This information is not intended to replace advice given to you by your health care provider. Make sure you discuss any questions you have with your health care provider. Document Revised: 02/25/2019 Document Reviewed: 02/25/2019 Elsevier Patient Education  2022 [employer].

## 2021-03-15 ENCOUNTER — Encounter: Payer: Self-pay | Admitting: Physician Assistant

## 2022-02-19 IMAGING — US US EXTREM LOW VENOUS*L*
1 series · 13 of 24 positions shown · non-contrast
Comparison: None.

CLINICAL DATA: IV drug use. Abscess or DVT. Swelling and redness of
the left lower extremity.

EXAM:
Left LOWER EXTREMITY VENOUS DOPPLER ULTRASOUND
TECHNIQUE: Gray-scale sonography with compression, as well as color and duplex
ultrasound, were performed to evaluate the deep venous system(s)
from the level of the common femoral vein through the popliteal and
proximal calf veins.

[Series 1: us venous img lower uni left (dvt) · portal-venous · 33 acquisitions, 13 frames shown]
[im 1/33]
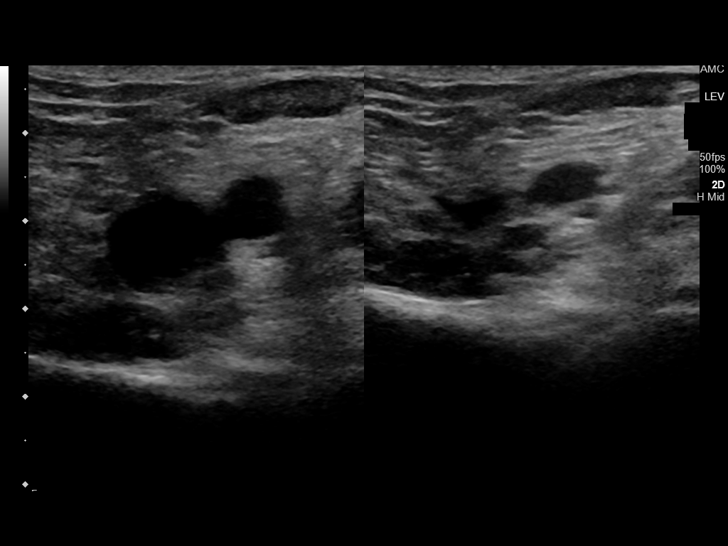
[im 3/33]
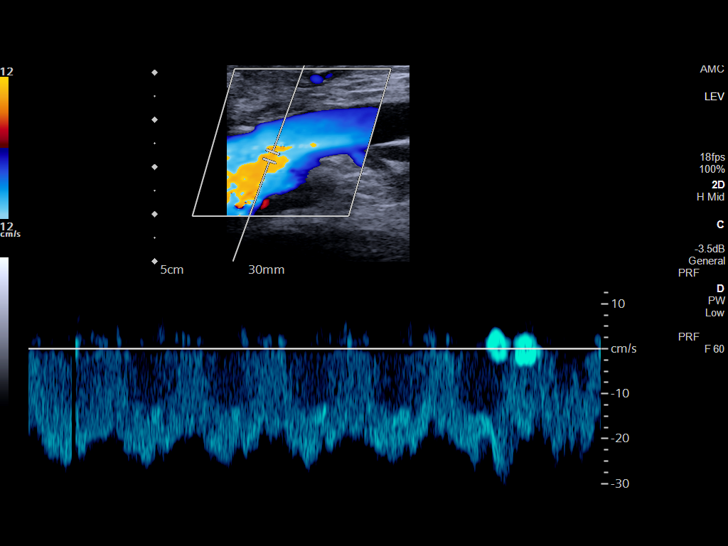
[im 6/33]
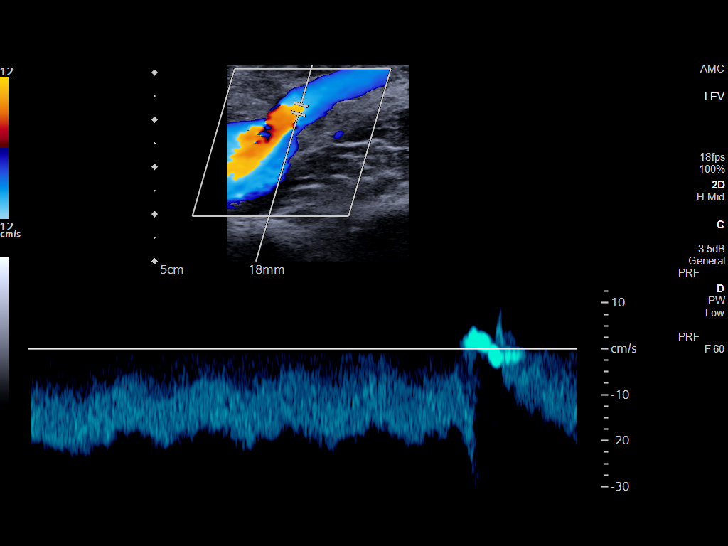
[im 9/33]
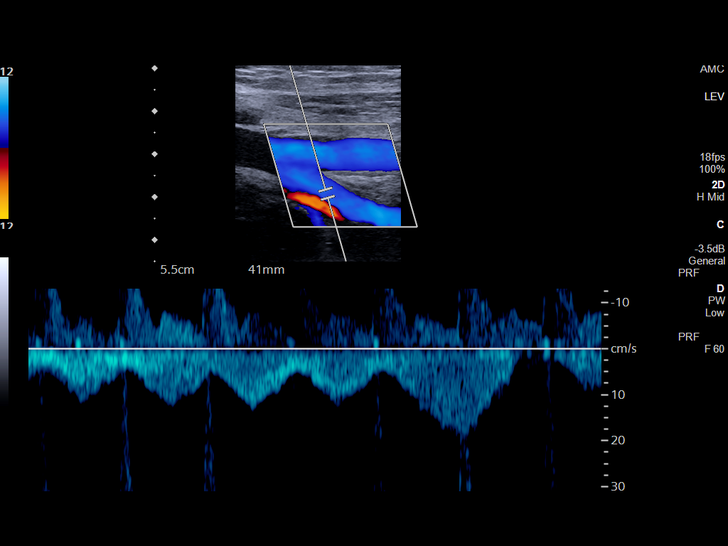
[im 12/33]
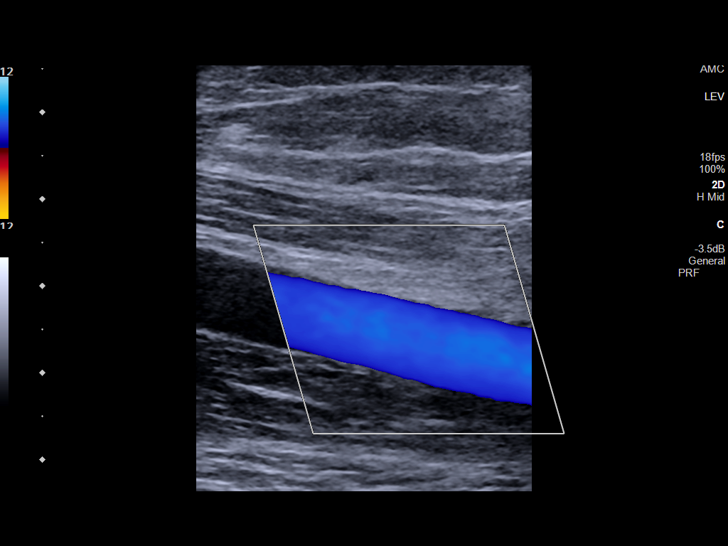
[im 14/33]
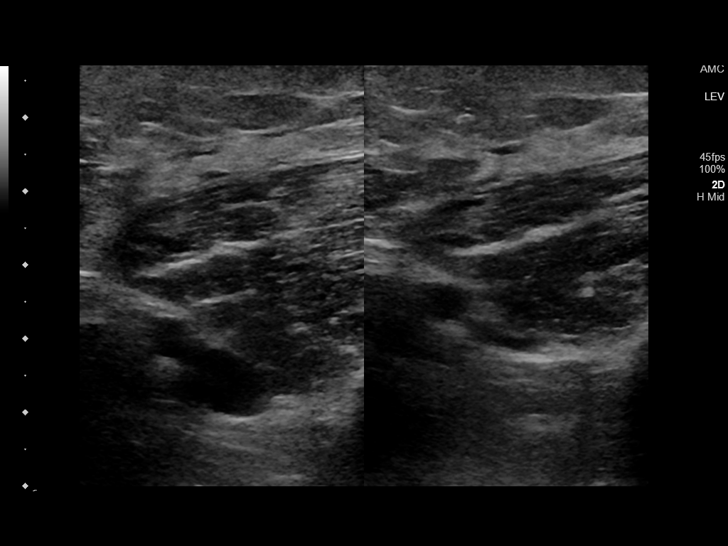
[im 19/33]
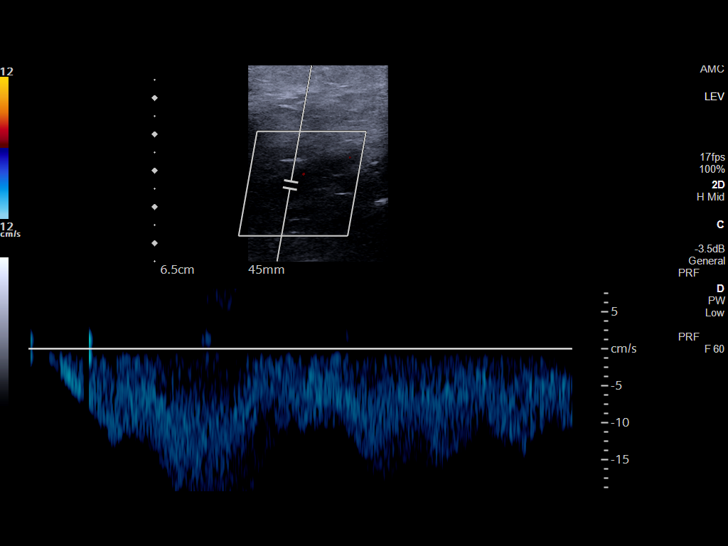
[im 20/33]
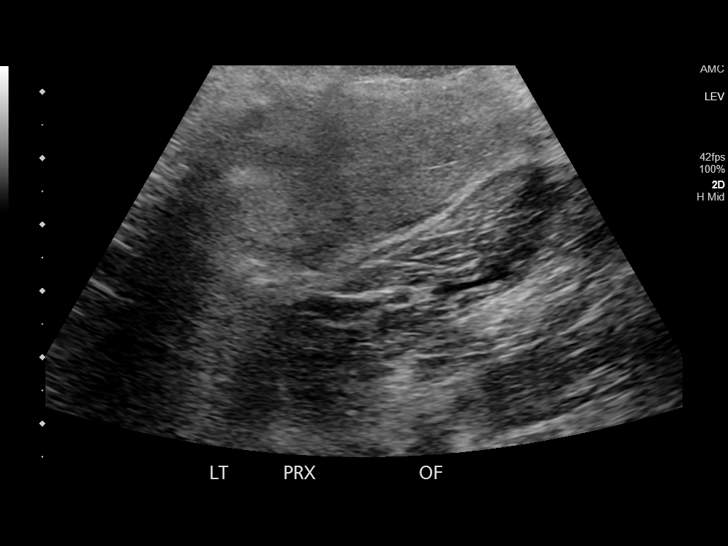
[im 23/33]
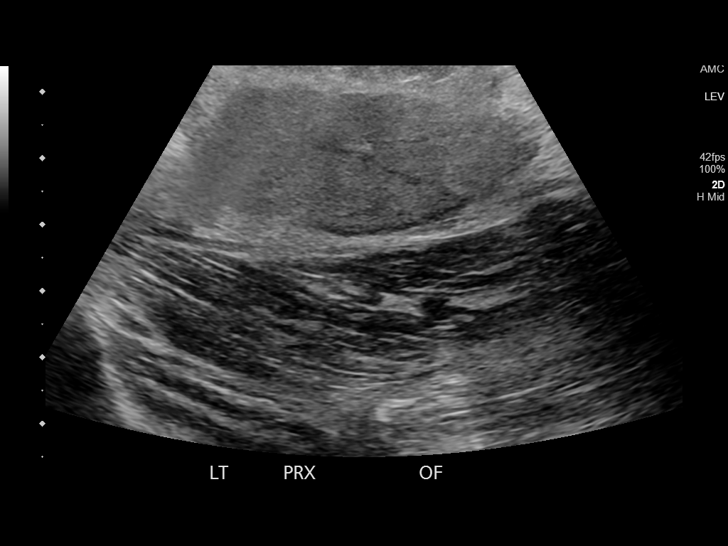
[im 26/33]
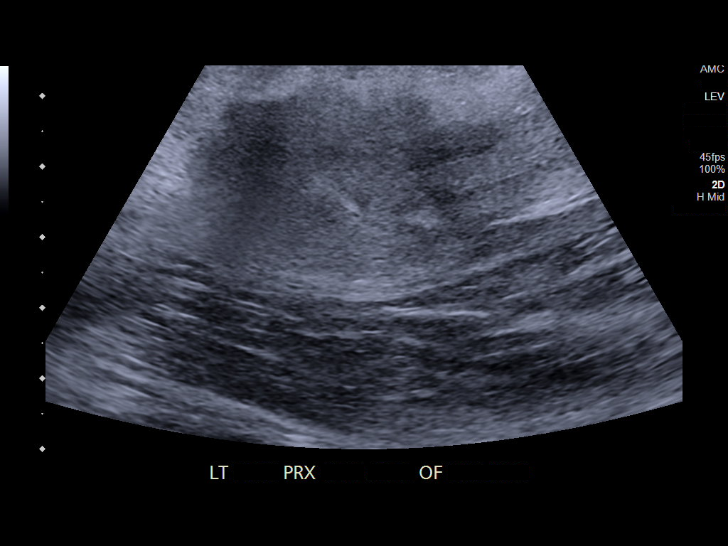
[im 27/33]
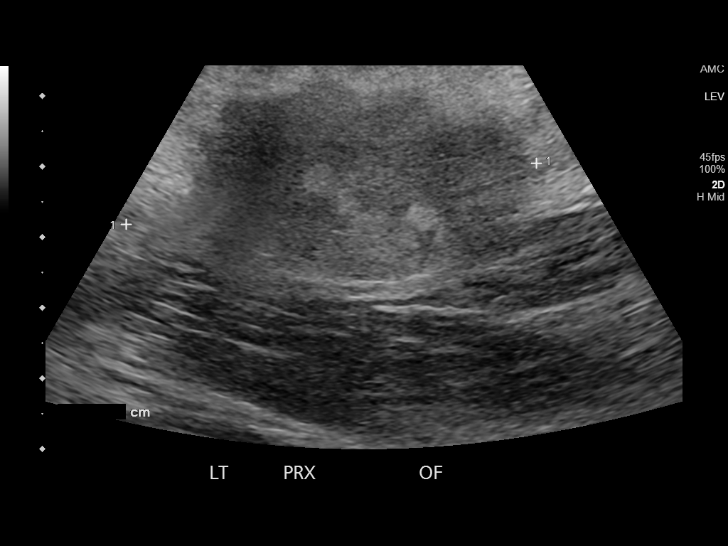
[im 30/33]
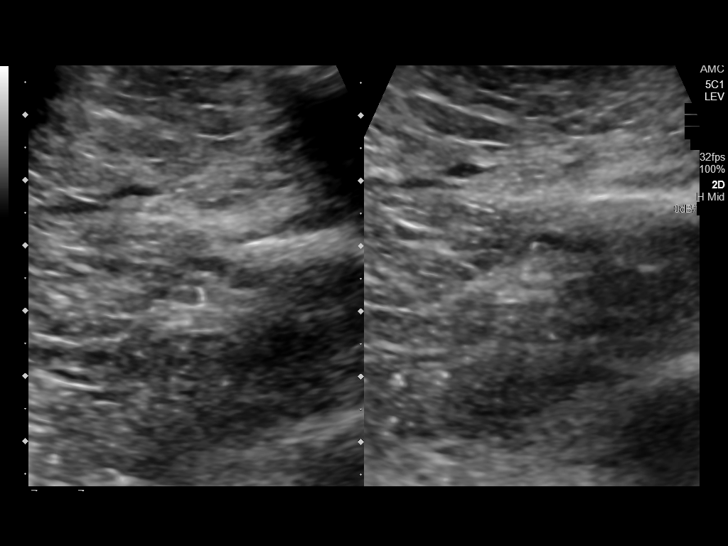
[im 33/33]
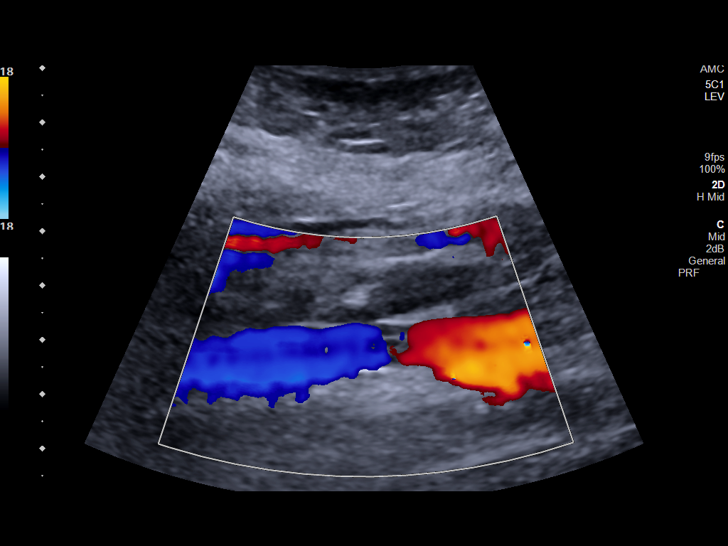

[13 of 24 positions shown; findings below may reference images not displayed]

FINDINGS: VENOUS

Normal compressibility of the common femoral, superficial femoral,
and popliteal veins, as well as the visualized calf veins.
Visualized portions of profunda femoral vein and great saphenous
vein unremarkable. No filling defects to suggest DVT on grayscale or
color Doppler imaging. Doppler waveforms show normal direction of
venous flow, normal respiratory plasticity and response to
augmentation.

OTHER

There is a 5.4 x 2.7 x 6 cm heterogeneous isoechoic fluid collection
within the popliteal fossa that extends inferiorly to the proximal
calf.

Limitations: none
IMPRESSION: 1. No left lower extremity deep venous thrombosis.
2. A 5.4 x 2.7 x 6 cm heterogeneous isoechoic fluid collection
within the left popliteal fossa extends inferiorly to the proximal
calf. Finding may represent a liquified hematoma versus abscess.

## 2022-02-19 IMAGING — DX DG CHEST 1V PORT
1 series · 1 of 1 positions shown · non-contrast
Comparison: None.

CLINICAL DATA: Questionable sepsis.

EXAM:
PORTABLE CHEST 1 VIEW

[chest ap]
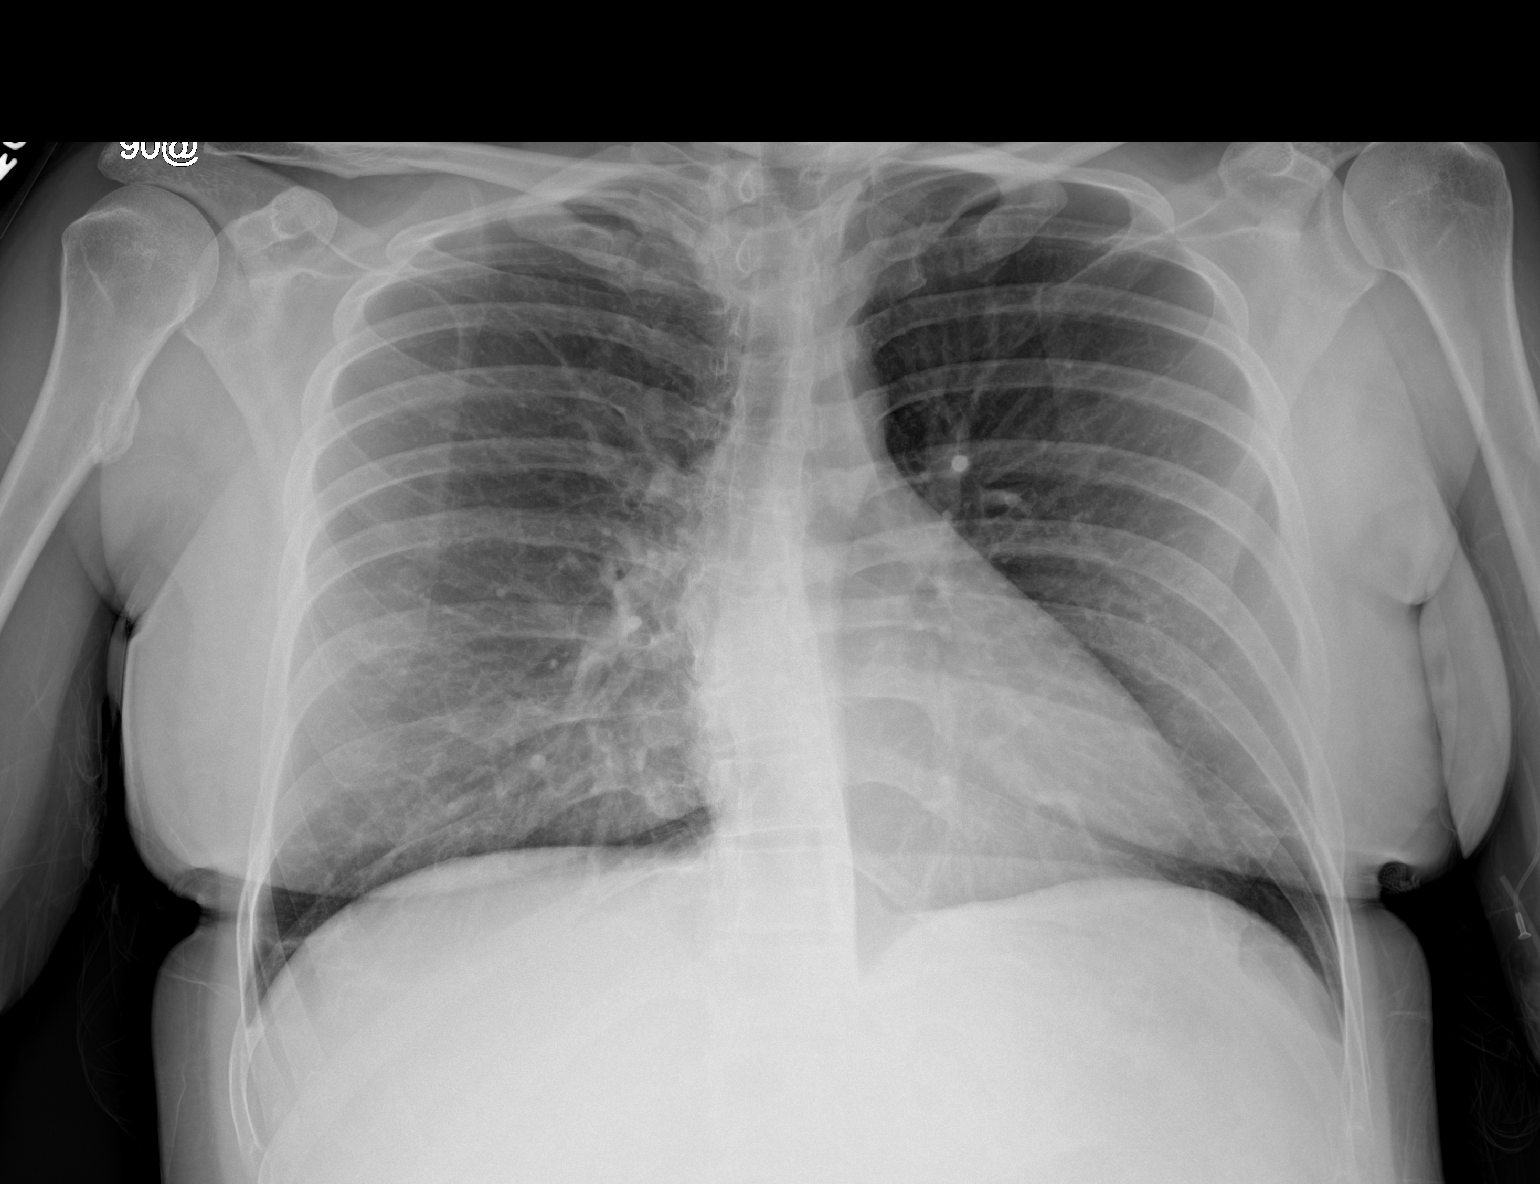

[1 of 1 positions shown; findings below may reference images not displayed]

FINDINGS: The heart, hila, and mediastinum are normal. No pneumothorax. No
nodules or masses. No focal infiltrates.
IMPRESSION: No active disease.
